# Patient Record
Sex: Female | Born: 1935 | Race: Black or African American | Hispanic: No | Marital: Single | Smoking: Never smoker
Health system: Southern US, Community
[De-identification: ages and names within clinical notes are randomized; demographics above are authoritative.]

## PROBLEM LIST (undated history)

## (undated) DIAGNOSIS — I10 Essential (primary) hypertension: Secondary | ICD-10-CM

## (undated) DIAGNOSIS — D573 Sickle-cell trait: Secondary | ICD-10-CM

## (undated) DIAGNOSIS — G894 Chronic pain syndrome: Secondary | ICD-10-CM

## (undated) DIAGNOSIS — E079 Disorder of thyroid, unspecified: Secondary | ICD-10-CM

## (undated) DIAGNOSIS — N289 Disorder of kidney and ureter, unspecified: Secondary | ICD-10-CM

## (undated) HISTORY — DX: Sickle-cell trait: D57.3

## (undated) HISTORY — DX: Chronic pain syndrome: G89.4

## (undated) HISTORY — PX: APPENDECTOMY: SHX54

## (undated) HISTORY — DX: Essential (primary) hypertension: I10

---

## 1995-09-14 DIAGNOSIS — G894 Chronic pain syndrome: Secondary | ICD-10-CM

## 1995-09-14 HISTORY — PX: BACK SURGERY: SHX140

## 1995-09-14 HISTORY — DX: Chronic pain syndrome: G89.4

## 2014-09-25 ENCOUNTER — Ambulatory Visit (INDEPENDENT_AMBULATORY_CARE_PROVIDER_SITE_OTHER): Payer: 59 | Admitting: Family Medicine

## 2014-09-25 ENCOUNTER — Encounter: Payer: Self-pay | Admitting: Family Medicine

## 2014-09-25 VITALS — BP 163/90 | HR 65 | Temp 98.0°F | Ht <= 58 in | Wt 180.0 lb

## 2014-09-25 DIAGNOSIS — E038 Other specified hypothyroidism: Secondary | ICD-10-CM

## 2014-09-25 DIAGNOSIS — M549 Dorsalgia, unspecified: Secondary | ICD-10-CM | POA: Diagnosis not present

## 2014-09-25 DIAGNOSIS — I10 Essential (primary) hypertension: Secondary | ICD-10-CM | POA: Diagnosis not present

## 2014-09-25 DIAGNOSIS — M1711 Unilateral primary osteoarthritis, right knee: Secondary | ICD-10-CM | POA: Diagnosis not present

## 2014-09-25 DIAGNOSIS — I5022 Chronic systolic (congestive) heart failure: Secondary | ICD-10-CM | POA: Diagnosis not present

## 2014-09-25 DIAGNOSIS — I5032 Chronic diastolic (congestive) heart failure: Secondary | ICD-10-CM | POA: Insufficient documentation

## 2014-09-25 DIAGNOSIS — G8929 Other chronic pain: Secondary | ICD-10-CM

## 2014-09-25 DIAGNOSIS — E039 Hypothyroidism, unspecified: Secondary | ICD-10-CM | POA: Insufficient documentation

## 2014-09-25 DIAGNOSIS — M170 Bilateral primary osteoarthritis of knee: Secondary | ICD-10-CM | POA: Insufficient documentation

## 2014-09-25 LAB — FERRITIN: Ferritin: 248 ng/mL (ref 10–291)

## 2014-09-25 LAB — TSH: TSH: 34.023 u[IU]/mL — AB (ref 0.350–4.500)

## 2014-09-25 MED ORDER — LEVOTHYROXINE SODIUM 100 MCG PO TABS: 100.0000 ug | ORAL_TABLET | Freq: Every day | ORAL | Status: DC

## 2014-09-25 MED ORDER — FUROSEMIDE 20 MG PO TABS: 20.0000 mg | ORAL_TABLET | Freq: Every day | ORAL | Status: DC

## 2014-09-25 MED ORDER — CYCLOBENZAPRINE HCL 5 MG PO TABS: 5.0000 mg | ORAL_TABLET | Freq: Three times a day (TID) | ORAL | Status: DC | PRN

## 2014-09-25 MED ORDER — POTASSIUM CHLORIDE ER 10 MEQ PO TBCR: 10.0000 meq | EXTENDED_RELEASE_TABLET | Freq: Every day | ORAL | Status: DC

## 2014-09-25 MED ORDER — CLONIDINE HCL 0.1 MG PO TABS: 0.1000 mg | ORAL_TABLET | Freq: Three times a day (TID) | ORAL | Status: DC

## 2014-09-25 MED ORDER — ENALAPRIL MALEATE 20 MG PO TABS: 20.0000 mg | ORAL_TABLET | Freq: Every day | ORAL | Status: DC

## 2014-09-25 NOTE — Progress Notes (Signed)
Subjective:    Patient ID: Emily Campos, female    DOB: 1926-05-21, 81 y.o.   MRN: [id number]  Patient presents to [facility] for new patient evaluation. Has not been to doctor >1 year  HPI  Patient reports that she has been out of all medications for approx 1-3 months. Requesting all refills today.  CHRONIC HTN: Reports - out of current BP meds. No concerns. Doesn't check BP at home. Current Meds - Enalapril 20mg  daily, Clonidine 0.1mg  TID, Lasix 10mg  daily (occasionally up to 20mg ) Previously tolerated well, w/o complaints. Lifestyle - no regular exercise / limited ambulation Denies CP, dyspnea, HA, edema, dizziness / lightheadedness  CHRONIC PAIN / LOW BACK / KNEES: - History of Chronic Low Back Pain, s/p back surgery ("electrodes placed" 1997), stated she had fall while at work and prior dx osteoarthritis in lumbar spine with "disc problems". Stable back pain without worsening, does admit to some occasional pain in right leg, but denies radiation of pain from back. - History of Right knee OA, no prior knee injuries or surgeries, last X-ray >10 years ago, chronic R>L knee pain with pain extending down into Right foot occasionally - Currently taking chronic opiates with Percocet, 5/325 taking every other day only as needed, prescribed by prior medical doctor. Previously took Motrin (advised to DC NSAIDs), no longer takes Tylenol (?intolerance) - Previously seen at Pain Management, not interested in returning - Takes Flexeril 5mg  PRN - Admits RLE chronic edema  - Denies fevers/chills, numbness, tingling, or weakness in legs, bladder / bowel incontinence, saddle anesthesia  H/o CHF: - Reports history of heart disease with CHF, but no further information known. Admits to previously established with Cardiology in [place]. Previously treated with Lasix 10mg  daily sometimes inc to 20mg  daily if needed. - Admits chronic LE edema (ankle/foot > lower leg) Right > Left - Denies any history of DVT/PE  (admits prior work-up in past with [place] due to swelling in Right leg) - Denies chest pain, SOB, orthopnea  PMH: - Hypothyroidism (currently out of levothyroxine) - H/o abdominal hernia x 10 years (not interested in surgery) - Sickle Cell Trait  HM: - influenza vaccine - declined (does not get regular flu shots) - Last Mammogram - unable to recall - Never had colonoscopy  I have reviewed and updated the following as appropriate: allergies and current medications  Social Hx:  - Moved from [place] to [place] in March 2015, family in [place] (cousin) with plans to move there eventually. Currently Grandson lives nearby in [place], daughter in [place] - Lives home alone, uses walker to ambulate, performs ADLs  Review of Systems  See above HPI   Objective:   Physical Exam  BP 163/90 mmHg  Pulse 65  Temp(Src) 98 F (36.7 C) (Oral)  Ht 4\' 3"  (1.295 m)  Wt 180 lb (81.647 kg)  BMI 48.69 kg/m2  Gen - chronically ill but well-appearing elderly F, resting comfortably with walker, NAD HEENT - NCAT, PERRL, EOMI, oropharynx clear, MMM Neck - supple, non-tender Heart - RRR, no murmurs heard Lungs - CTAB, no wheezing, crackles, or rhonchi. Normal work of breathing. MSK - - Back: mild tenderness over lower thoracic and all lumbar spinous processes and mild lumbar paraspinal tenderness w/ mild paraspinal muscle hypertonicity. Seated SLR Right with back pain but no radicular symptoms, Left negative - Right Knee: symmetrical to Left without effusion, erythema, or deformity. +crepitus with ROM, limited full knee flexion, generalized tenderness to palpation anterior aspect Ext - mild tenderness to  Right foot/ankle with +1 pitting edema foot to mid shin, peripheral pulses intact Skin - warm, dry, no rashes Neuro - awake, alert, oriented, grossly non-focal, intact muscle strength 5/5 b/l grip, knee flex/ext, ankle dorsi/plantar flexion    Assessment & Plan:   See specific A&P problem list for details.

## 2014-09-25 NOTE — Assessment & Plan Note (Addendum)
Reported chronic h/o CHF, followed by Cardiology in [place]. Without recent exacerbation. - Currently mostly euvolemic with some inc chronic edema RLE > L - Out of medications currently - Unknown when last ECHO, no records  Plan: 1. Order ECHO for further evaluation, consider future referral to Cardiology to get established 2. Reorder Lasix 20mg  daily (usually takes 10mg  daily, but sometimes needs inc to 20) 3. Reorder potassium supplement to take daily with Lasix 4. Reorder ACEi / Enalapril 20mg  daily 5. Check ferritin for CHF 6. RTC 1 month re-eval

## 2014-09-25 NOTE — Assessment & Plan Note (Signed)
Chronic h/o hypothyroidism on levothyroxine, however currently admits to out of all meds 1-3 months since moved to [facility]  Plan: 1. Order TSH - significantly elevated at 34 2. Resume Levothyroxine therapy at previous dose 169mcg daily 3. Consider repeat TSH in 6-8 weeks on therapy, titrate as needed

## 2014-09-25 NOTE — Assessment & Plan Note (Signed)
Elevated BP, currently out of medications, above goal < 150/90 for elderly pt  Plan: 1. Reorder Enalapril 20mg  daily 2. Reorder Lasix 10-20mg  daily 3. Reorder Clonidine 0.1mg  TID - question reasoning for clonidine in regimen, but plan to resume prior meds for several months to see if controlled, otherwise would consider switching to alternative therapy 4. Check BMET, Lipid panel today 5. RTC 1 month

## 2014-09-25 NOTE — Assessment & Plan Note (Signed)
Chronic LBP with reported h/o OA with DJD, s/p electrode implant back surgery, initial injury with fall 10 years ago. - No previous records or imaging available  Plan: 1. Ordered T and L spine X-rays to eval OA/DJD 2. Refilled Flexeril PRN (takes low dose 2.5mg  to 5mg ) 3. Defer rx Percocet 5/325 today - follow-up X-rays, records, evaluate pain at future visit, re-attempt discuss options with Pain Management referral, not interested in other modalities such as PT or injections, would also need Pain Contract and UDS if proceeding 4. Plan to request records from previous doctors in [place] 5. RTC 1 month re-eval

## 2014-09-25 NOTE — Patient Instructions (Signed)
Dear Emily Campos, Thank you for coming in to clinic today. It was good to meet you!  1. Today we discuss your general medical health. 2. I have ordered your medications - sent to pharmacy 3. Ordered X-rays of your Back and Knees - go to [facility] (Radiology Department) to get these done at anytime. 4. Ordered an ECHOcardiogram, we will work with you to schedule this, and plan to refer you to cardiologist in future.  Some important numbers from today's visit: BP - BP 163/90 Results -  Please schedule a follow-up appointment with Dr. Parks Ranger 1 month to review X-rays and lab results. Discuss pain medication.  If you have any other questions or concerns, please feel free to call the clinic to contact me. You may also schedule an earlier appointment if necessary.  However, if your symptoms get significantly worse, please go to the Emergency Department to seek immediate medical attention.  Nobie Putnam, [place]

## 2014-09-25 NOTE — Assessment & Plan Note (Addendum)
Stable, consistent with chronic R-knee OA. Likely increased edema of Right leg due to limited movement/ROM. - No reported recent images, no other records  Plan: 1. Ordered bilateral knee standing X-rays eval OA 2. Refilled Flexeril per request 3. See details under "Chronic Back Pain" for pain management

## 2014-09-26 ENCOUNTER — Encounter: Payer: Self-pay | Admitting: Family Medicine

## 2014-09-26 DIAGNOSIS — N183 Chronic kidney disease, stage 3 unspecified: Secondary | ICD-10-CM | POA: Insufficient documentation

## 2014-09-26 LAB — LIPID PANEL
Cholesterol: 224 mg/dL — ABNORMAL HIGH (ref 0–200)
HDL: 42 mg/dL (ref >39)
LDL Cholesterol: 147 mg/dL — ABNORMAL HIGH (ref 0–99)
Total CHOL/HDL Ratio: 5.3 Ratio
Triglycerides: 177 mg/dL — ABNORMAL HIGH (ref <150)
VLDL: 35 mg/dL (ref 0–40)

## 2014-09-26 LAB — BASIC METABOLIC PANEL
BUN: 21 mg/dL (ref 6–23)
CO2: 23 mEq/L (ref 19–32)
CREATININE: 1.73 mg/dL — AB (ref 0.50–1.10)
Calcium: 9.3 mg/dL (ref 8.4–10.5)
Chloride: 106 mEq/L (ref 96–112)
Glucose, Bld: 87 mg/dL (ref 70–99)
POTASSIUM: 4.2 meq/L (ref 3.5–5.3)
Sodium: 138 mEq/L (ref 135–145)

## 2014-10-04 ENCOUNTER — Ambulatory Visit (HOSPITAL_COMMUNITY): Payer: 59

## 2014-11-01 ENCOUNTER — Telehealth: Payer: Self-pay | Admitting: Family Medicine

## 2014-11-01 NOTE — Telephone Encounter (Signed)
Appointment scheduled for 2/23 at [facility]. Tried calling patient to inform, line busy, will try again later.

## 2014-11-01 NOTE — Telephone Encounter (Signed)
Emily Campos need to have the appt for the Echo [facility] at [facility] rescheduled due to missed appt.  Please call when new one made

## 2014-11-04 NOTE — Telephone Encounter (Signed)
Patient informed, expressed understanding. 

## 2014-11-05 ENCOUNTER — Ambulatory Visit (HOSPITAL_COMMUNITY)
Admission: RE | Admit: 2014-11-05 | Discharge: 2014-11-05 | Disposition: A | Discharge status: Home / Self Care | Payer: Medicare Other | Source: Ambulatory Visit | Attending: Family Medicine | Admitting: Family Medicine

## 2014-11-05 DIAGNOSIS — I5022 Chronic systolic (congestive) heart failure: Secondary | ICD-10-CM | POA: Insufficient documentation

## 2014-11-05 DIAGNOSIS — I509 Heart failure, unspecified: Secondary | ICD-10-CM

## 2014-11-05 NOTE — Progress Notes (Signed)
  Echocardiogram 2D Echocardiogram has been performed.  Emily Campos M 11/05/2014, 4:04 PM

## 2014-11-06 ENCOUNTER — Encounter: Payer: Self-pay | Admitting: Family Medicine

## 2015-03-14 ENCOUNTER — Ambulatory Visit: Payer: Medicare Other | Admitting: Family Medicine

## 2015-03-19 ENCOUNTER — Ambulatory Visit (HOSPITAL_COMMUNITY)
Admission: RE | Admit: 2015-03-19 | Discharge: 2015-03-19 | Disposition: A | Discharge status: Home / Self Care | Payer: Medicare Other | Source: Ambulatory Visit | Attending: Family Medicine | Admitting: Family Medicine

## 2015-03-19 ENCOUNTER — Ambulatory Visit (INDEPENDENT_AMBULATORY_CARE_PROVIDER_SITE_OTHER): Payer: Medicare Other | Admitting: Family Medicine

## 2015-03-19 ENCOUNTER — Encounter: Payer: Self-pay | Admitting: Family Medicine

## 2015-03-19 ENCOUNTER — Other Ambulatory Visit: Payer: Self-pay | Admitting: Family Medicine

## 2015-03-19 VITALS — BP 190/88 | Temp 98.2°F | Ht <= 58 in | Wt 171.3 lb

## 2015-03-19 DIAGNOSIS — N183 Chronic kidney disease, stage 3 unspecified: Secondary | ICD-10-CM

## 2015-03-19 DIAGNOSIS — M1711 Unilateral primary osteoarthritis, right knee: Secondary | ICD-10-CM

## 2015-03-19 DIAGNOSIS — I10 Essential (primary) hypertension: Secondary | ICD-10-CM

## 2015-03-19 DIAGNOSIS — E785 Hyperlipidemia, unspecified: Secondary | ICD-10-CM | POA: Insufficient documentation

## 2015-03-19 DIAGNOSIS — M545 Low back pain: Secondary | ICD-10-CM | POA: Insufficient documentation

## 2015-03-19 DIAGNOSIS — E038 Other specified hypothyroidism: Secondary | ICD-10-CM

## 2015-03-19 DIAGNOSIS — M549 Dorsalgia, unspecified: Secondary | ICD-10-CM | POA: Diagnosis not present

## 2015-03-19 DIAGNOSIS — M25561 Pain in right knee: Secondary | ICD-10-CM | POA: Insufficient documentation

## 2015-03-19 DIAGNOSIS — G8929 Other chronic pain: Secondary | ICD-10-CM | POA: Diagnosis not present

## 2015-03-19 DIAGNOSIS — M25562 Pain in left knee: Secondary | ICD-10-CM | POA: Insufficient documentation

## 2015-03-19 DIAGNOSIS — D259 Leiomyoma of uterus, unspecified: Secondary | ICD-10-CM | POA: Insufficient documentation

## 2015-03-19 DIAGNOSIS — I5032 Chronic diastolic (congestive) heart failure: Secondary | ICD-10-CM

## 2015-03-19 DIAGNOSIS — M5136 Other intervertebral disc degeneration, lumbar region: Secondary | ICD-10-CM | POA: Diagnosis not present

## 2015-03-19 DIAGNOSIS — M4186 Other forms of scoliosis, lumbar region: Secondary | ICD-10-CM | POA: Diagnosis not present

## 2015-03-19 DIAGNOSIS — M170 Bilateral primary osteoarthritis of knee: Secondary | ICD-10-CM

## 2015-03-19 LAB — BASIC METABOLIC PANEL WITH GFR
BUN: 15 mg/dL (ref 6–23)
CO2: 22 mEq/L (ref 19–32)
Calcium: 9.5 mg/dL (ref 8.4–10.5)
Chloride: 105 mEq/L (ref 96–112)
Creat: 1.49 mg/dL — ABNORMAL HIGH (ref 0.50–1.10)
GFR, EST NON AFRICAN AMERICAN: 32 mL/min — AB
GFR, Est African American: 36 mL/min — ABNORMAL LOW
Glucose, Bld: 89 mg/dL (ref 70–99)
POTASSIUM: 4.2 meq/L (ref 3.5–5.3)
SODIUM: 140 meq/L (ref 135–145)

## 2015-03-19 LAB — TSH: TSH: 2.981 u[IU]/mL (ref 0.350–4.500)

## 2015-03-19 MED ORDER — ENALAPRIL MALEATE 20 MG PO TABS: 20.0000 mg | ORAL_TABLET | Freq: Every day | ORAL | Status: DC

## 2015-03-19 MED ORDER — CLONIDINE HCL 0.1 MG PO TABS: 0.1000 mg | ORAL_TABLET | Freq: Three times a day (TID) | ORAL | Status: DC

## 2015-03-19 MED ORDER — POTASSIUM CHLORIDE ER 10 MEQ PO TBCR: 10.0000 meq | EXTENDED_RELEASE_TABLET | Freq: Every day | ORAL | Status: DC

## 2015-03-19 MED ORDER — PRAVASTATIN SODIUM 40 MG PO TABS: 40.0000 mg | ORAL_TABLET | Freq: Every day | ORAL | Status: DC

## 2015-03-19 MED ORDER — FUROSEMIDE 20 MG PO TABS: 10.0000 mg | ORAL_TABLET | Freq: Every day | ORAL | Status: DC

## 2015-03-19 NOTE — Progress Notes (Signed)
   Subjective:    Patient ID: Emily Campos, female    DOB: 03/24/1926, 81 y.o.   MRN: [id number]  Patient presents with grandson Kasandra Knudsen.  HPI  CHRONIC HTN: Reports - out of current BP meds. No concerns. Doesn't check BP at home. Current Meds - Enalapril 20mg  daily, Clonidine 0.1mg  TID (misses doses, forgets), Lasix 10mg  daily (occasionally up to 20mg ) Previously tolerated well, w/o complaints. Lifestyle - no regular exercise / limited ambulation Denies CP, dyspnea, HA, edema, dizziness / lightheadedness  CHRONIC PAIN / LOW BACK / KNEES: - History of Chronic Low Back Pain, s/p back surgery ("electrodes placed" 1997), known Lumbar OA and prior fall injury >10 years ago. No prior imaging on chart and not available, did not complete ROI at last visit. - X-rays ordered at last visit but not obtained by patient. - Previously treated with chronic opiates Percocet 5/325 taking qod PRN rx by PCP in [place], have not received any records yet. No longer able to take NSAIDs due to CKD. Has not tried Tylenol. - Taking Flexeril PRN - Requesting for chronic narcotic rx today, initially declines to see Pain Management, but unable to provide appropriate reasoning. Now willing to establish with Pain Management for evaluation. - Denies fevers/chills, numbness, tingling, or weakness in legs, bladder / bowel incontinence, saddle anesthesia  Chronic Diastolic Grd 1 CHF, HFpEF: - Chronic known h/o prior CHF, no prior records from Cardiology in [place], had been on intermittent Lasix PRN b/l LE edema. Recently obtained ECHO 10/2014 with preserved LVEF 01-60%, Grd 1 diastolic dysfunction, otherwise normal wall motion, mild MR, without other valvular abnormalities. - Currently stable symptoms. Chronic known orthopnea at 4 pillows, sleeps elevated without change. Continues to have chronic b/l R>L lower leg edema, tries to elevate, does not wear compression stockings - Taking Lasix 20mg  (half tabs at 10mg  daily PRN, most  days) - Not established with Cardiology yet in [place] - Denies new or worsening CP, SOB, palpitations, syncope  HYPOTHYROIDISM: - Known chronic h/o hypothyroidism. Last TSH 34 in 09/2014, had been off levothyroxine for months. - Currently taking Levothyroxine 18mcg daily, occasionally misses doses - No new complaints  HM: - Declines immunizations. Does not want to discuss colonoscopy, mammogram.  I have reviewed and updated the following as appropriate: allergies and current medications  Social Hx:  - Living in apartment, alone, uses walker to ambulate, able to perform ADLs, grandson Kasandra Knudsen nearby often provides help with medications. - Moved from [place] to [facility] in March 2015  Review of Systems  See above HPI   Objective:   Physical Exam  Temp(Src) 98.2 F (36.8 C) (Oral)  Ht 4\' 3"  (1.295 m)  Wt 171 lb 4.8 oz (77.701 kg)  BMI 46.33 kg/m2  Gen - elderly AAF, chronically ill but well-appearing currently, comfortable in wheelchair, NAD HEENT - oropharynx clear, MMM Neck - supple, non-tender, no thyromegaly Heart - RRR, no murmurs heard Lungs - CTAB, no wheezing, crackles, or rhonchi. Non-labored. Good air movement. MSK - - Back: Stable exam from previous with mild +TTP lower T-L spine. B/l paraspinal muscle hypertonicity, seated SLR R>L back pain without radiculopathy Ext - Stable R>L +1 pitting edema from foot to below knee, non-tender, symmetrical calves without erythema, peripheral pulses intact Skin - warm, dry, no rashes Neuro - awake, alert, orientedx3, grossly non-focal, intact muscle strength 5/5 b/l grip, knee flex/ext, ankle dorsi/plantar flexion    Assessment & Plan:   See specific A&P problem list for details.

## 2015-03-19 NOTE — Assessment & Plan Note (Addendum)
Presumed b/l SCr approx 1.7, known CKD previously. Likely due to chronic uncontrolled HTN - No NSAIDs  Plan: 1. Re-check BMET 2. Adjust lasix dose PRN, referral to Cards to assist with med management, in dCHF 3. Consider Renal referral if stable to worsening, currently on ACEi  UPDATE 7/6 - BMET results with SCr 1.49 (from 1.73), stable K, otherwise normal. Likely new baseline, stable CKD-III

## 2015-03-19 NOTE — Assessment & Plan Note (Signed)
Elevated lipids, with LDL >140. Elevated ASCVD 10 yr risk, indicated for mod intensity statin if tolerated. No previous h/o on statin therapy.  Plan: 1. Start Pravastatin 40mg  daily, titrate according to side-effects, counseled on myalgias 2. RTC PRN

## 2015-03-19 NOTE — Assessment & Plan Note (Addendum)
Significantly elevated BP up to SBP 190, asymptomatic. Manual re-check down to SBP 160, out of Enalapril and Clonidine (had previously missed doses, 3 mo supply lasted 6 mo, did not request refills or follow-up as instructed). Above goal < 150/90 - Poor med adherence (?memory difficulties, now with grandson helping for meds)  Plan: 1. Refilled anti-HTN meds - Enalapril 20mg  daily, Clonidine 0.1mg  TID, Lasix 10-20mg  daily - given 1 year supply 2. Improve med adherence - pill organizer, have grandson help 3. Re-check BMET, elevated SCr at last check 1.7 (known CKD, likely new baseline) 4. Lifestyle mods - reduce salt/sodium intake, inc fresh vegetables, limited exercise 5. RTC 1 month for BP re-check, will call to remind pt given poor follow-up previously 6. Referral to Cardiology for f/u [facility] and uncontrolled HTN

## 2015-03-19 NOTE — Assessment & Plan Note (Addendum)
Stable, chronic hypothyroidism. Concerns with missed doses of levothyroxine. - Last TSH 34 (09/2014)  Plan: 1. Continue Levothyroxine 191mcg daily, advised adherence to meds 2. Re-check TSH today - results at 2.981 (normal range), no change to dose.

## 2015-03-19 NOTE — Assessment & Plan Note (Signed)
Chronic LBP with reported h/o OA with DJD, s/p electrode implant back surgery, initial injury with fall 10 years ago. - Did not follow-up with previously ordered X-rays  Plan: 1. Advised to go to [facility] to get previously ordered X-rays (T/L spine, b/l knees) 2. May continue Flexeril 3. ROI completed for PCP in [place] 4. Referral to Pain Management - not offer chronic narcotics today  UPDATE 7/6 X-ray results: L-knee: No acute abnormality. Severe tricompartment degenerative change R-knee: No acute. Mild degenerative changes in the patellofemoral compartment. T-spine: No acute. Moderate thoracolumbar dextroscoliosis L-spine: DJD L3-S1, with straightened aligment, s/p bony fusion L2-L5, Lumbar dextroscoliosis 31 deg

## 2015-03-19 NOTE — Assessment & Plan Note (Signed)
Stable, chronic diastolic CHF (grd 1 diastolic dysfxn) with HFpEF - Last ECHO 10/2014: LVEF 60-65% - Previously ferritin normal >200  Plan: 1. Referral to Cardiology to resume chronic follow-up, also with poor BP control 2. Refilled Lasix 10-20mg  daily 3. Continue K supplement 4. Cont ACEi

## 2015-03-19 NOTE — Patient Instructions (Addendum)
Dear Emily Campos, Thank you for coming in to clinic today. It was good to see you, and good to meet you Emily Campos.  1. Refilled your medications today, given 1 year supply. Make sure you use a pill box and have Emily Campos help you organize your meds so you don't forget to take them. 2. BP elevated but improved on re-check. 3. Re-check labs today - Kidney Function and TSH 4. Start new cholesterol medication - Pravastatin take one tablet nightly - if you develop significant muscle cramps or pains, please call us and stop the medication. This is a possible side-effect. 5. Referral placed to Cardiology (heart doctor) they will call you with apt 6. Referral placed to Pain Management - they will call you with apt  Some important numbers from today's visit:  BP - Initially 190s >> improved to 162/90 Results -  Ordered X-rays of your Back and Knees - go to [facility] (Radiology Department) to get these done at anytime.  Please schedule a follow-up appointment with Dr. Parks Ranger in 1 month to re-check BP and 6 months to follow-up  If you have any other questions or concerns, please feel free to call the clinic to contact me. You may also schedule an earlier appointment if necessary.  However, if your symptoms get significantly worse, please go to the Emergency Department to seek immediate medical attention.  Nobie Putnam, [place]

## 2015-03-20 ENCOUNTER — Telehealth: Payer: Self-pay | Admitting: Family Medicine

## 2015-03-20 ENCOUNTER — Other Ambulatory Visit: Payer: Self-pay | Admitting: Family Medicine

## 2015-03-20 DIAGNOSIS — M549 Dorsalgia, unspecified: Principal | ICD-10-CM

## 2015-03-20 DIAGNOSIS — E038 Other specified hypothyroidism: Secondary | ICD-10-CM

## 2015-03-20 DIAGNOSIS — G8929 Other chronic pain: Secondary | ICD-10-CM

## 2015-03-20 MED ORDER — LEVOTHYROXINE SODIUM 100 MCG PO TABS: 100.0000 ug | ORAL_TABLET | Freq: Every day | ORAL | Status: DC

## 2015-03-20 MED ORDER — CYCLOBENZAPRINE HCL 5 MG PO TABS: 5.0000 mg | ORAL_TABLET | Freq: Three times a day (TID) | ORAL | Status: DC | PRN

## 2015-03-20 NOTE — Telephone Encounter (Signed)
Refilled Flexeril with +2 refills and Levothyroxine with +11 refills.  Nobie Putnam, [place], PGY-3

## 2015-03-20 NOTE — Telephone Encounter (Signed)
Pt called because she needs refills on her Flexeril and Levothyroxine. jw

## 2015-03-20 NOTE — Telephone Encounter (Signed)
Called patient to review lab and x-ray results from last OV 7/6. See detailed progress note for discussion. Briefly, SCr 1.49 (stable at baseline, CKD-III), otherwise BMET unremarkable. TSH normalized at 2.98 (from 34 in 09/2014), continue current Levothyroxine dose 135mcg daily without change (refills sent to pharmacy 1 year supply). Reviewed X-ray results, known multi-site DJD bilateral knees, T/L spine, with dextroscoliosis and some straightening, bony fusion s/p surgery from history of traumatic injury/fall. Pending scheduling from referral on 7/6 for Pain Management.  Additionally, given elevated BP yesterday, SBP 190s, down to SBP 160s on manual re-check. Pt non-adherent to BP meds. Advised patient to follow-up within next 4 weeks for HTN follow-up for BP check. Encouraged med adherence, family to help. Follow-up sooner if needed.  Nobie Putnam, [place], PGY-3

## 2015-03-31 ENCOUNTER — Telehealth: Payer: Self-pay | Admitting: *Deleted

## 2015-03-31 DIAGNOSIS — I10 Essential (primary) hypertension: Secondary | ICD-10-CM

## 2015-03-31 MED ORDER — ENALAPRIL MALEATE 20 MG PO TABS: 20.0000 mg | ORAL_TABLET | Freq: Every day | ORAL | Status: DC

## 2015-03-31 NOTE — Telephone Encounter (Signed)
Pharmacist call to request 90 day supply.  Verbal order given by Dr. Parks Ranger to change quantity to 90 day, with 3 refills.  Derl Barrow, RN

## 2015-04-11 ENCOUNTER — Ambulatory Visit: Payer: Medicare Other | Admitting: Internal Medicine

## 2015-05-20 ENCOUNTER — Encounter: Payer: Self-pay | Admitting: *Deleted

## 2015-05-22 ENCOUNTER — Ambulatory Visit (INDEPENDENT_AMBULATORY_CARE_PROVIDER_SITE_OTHER): Payer: Medicare Other | Admitting: Interventional Cardiology

## 2015-05-22 ENCOUNTER — Encounter: Payer: Self-pay | Admitting: Interventional Cardiology

## 2015-05-22 VITALS — BP 190/92 | HR 62 | Ht <= 58 in | Wt 174.0 lb

## 2015-05-22 DIAGNOSIS — I10 Essential (primary) hypertension: Secondary | ICD-10-CM | POA: Diagnosis not present

## 2015-05-22 DIAGNOSIS — I5032 Chronic diastolic (congestive) heart failure: Secondary | ICD-10-CM | POA: Diagnosis not present

## 2015-05-22 DIAGNOSIS — I1310 Hypertensive heart and chronic kidney disease without heart failure, with stage 1 through stage 4 chronic kidney disease, or unspecified chronic kidney disease: Secondary | ICD-10-CM | POA: Diagnosis not present

## 2015-05-22 DIAGNOSIS — N183 Chronic kidney disease, stage 3 (moderate): Secondary | ICD-10-CM

## 2015-05-22 DIAGNOSIS — I119 Hypertensive heart disease without heart failure: Secondary | ICD-10-CM

## 2015-05-22 DIAGNOSIS — IMO0001 Reserved for inherently not codable concepts without codable children: Secondary | ICD-10-CM

## 2015-05-22 DIAGNOSIS — E785 Hyperlipidemia, unspecified: Secondary | ICD-10-CM

## 2015-05-22 MED ORDER — AMLODIPINE BESYLATE 5 MG PO TABS: 5.0000 mg | ORAL_TABLET | Freq: Every day | ORAL | Status: DC

## 2015-05-22 NOTE — Patient Instructions (Signed)
Medication Instructions:   START TAKING AMLODIPINE 5 MG ONCE A DAY    Labwork:  NONE ORDER TODAY    Testing/Procedures:  NONE ORDER TODAY   Follow-Up:  IN 2 TO 3 WEEKS  WITH HYPERTENSION CLINIC WITH  Elita Boone D   Any Other Special Instructions Will Be Listed Below (If Applicable).

## 2015-05-22 NOTE — Progress Notes (Signed)
Patient ID: Athena Baltz, female   DOB: [birth year], 81 y.o.   MRN: [id number]     Cardiology Office Note   Date:  05/22/2015   ID:  Elizebeth Kluesner, DOB [birth year], MRN [id number]  PCP:  Nobie Putnam, DO    No chief complaint on file. f/u diastolic heart failure   Wt Readings from Last 3 Encounters:  05/22/15 174 lb (78.926 kg)  03/19/15 171 lb 4.8 oz (77.701 kg)  09/25/14 180 lb (81.647 kg)       History of Present Illness: Tiffiany Beadles is a 81 y.o. female  who has known history of diastolic heart failure. She moved here from [place].  Her daughter died of what sounds like prion disease in 2010.  She now has moved here to live with her grandson.  She would like to move to [place] as she has a cousin there.  Most recent echocardiogram in February 2016 showed normal left jugular function. She had been treated with Lasix in the past. She has also had difficult to control hypertension and associated renal insufficiency.  Se has chronic back pain s/p surgery after a fall.  SHe has had chronic right leg swelling.  She has been checked for DVT in the past that was negative.  She cannot lie flat due to chronic back pain.   She has had oxygen in the past but has not needed this for some time.   She has not taken her meds this AM.    Past Medical History  Diagnosis Date  . Sickle cell trait   . Chronic pain syndrome 1997  . HTN (hypertension)     Past Surgical History  Procedure Laterality Date  . Appendectomy    . Back surgery  1997     Current Outpatient Prescriptions  Medication Sig Dispense Refill  . cloNIDine (CATAPRES) 0.1 MG tablet Take 1 tablet (0.1 mg total) by mouth 3 (three) times daily. 90 tablet 11  . cyclobenzaprine (FLEXERIL) 5 MG tablet Take 1 tablet (5 mg total) by mouth 3 (three) times daily as needed for muscle spasms. 30 tablet 2  . enalapril (VASOTEC) 20 MG tablet Take 1 tablet (20 mg total) by mouth daily. 90 tablet 3  . furosemide  (LASIX) 20 MG tablet Take 0.5-1 tablets (10-20 mg total) by mouth daily. 30 tablet 5  . levothyroxine (SYNTHROID, LEVOTHROID) 100 MCG tablet Take 1 tablet (100 mcg total) by mouth daily before breakfast. 30 tablet 11  . potassium chloride (K-DUR) 10 MEQ tablet Take 1 tablet (10 mEq total) by mouth daily. 30 tablet 11  . pravastatin (PRAVACHOL) 40 MG tablet Take 1 tablet (40 mg total) by mouth at bedtime. 30 tablet 11   No current facility-administered medications for this visit.    Allergies:   Review of patient's allergies indicates no known allergies.    Social History:  The patient  reports that she has never smoked. She has never used smokeless tobacco. She reports that she does not drink alcohol or use illicit drugs.   Family History:  The patient's family history includes Heart disease in her mother; High blood pressure in her mother.    ROS:  Please see the history of present illness.   Otherwise, review of systems are positive for back pain and right leg swelling.   All other systems are reviewed and negative.    PHYSICAL EXAM: VS:  BP 190/92 mmHg  Pulse 62  Ht 4\' 3"  (1.295 m)  Wt 174 lb (78.926  kg)  BMI 47.06 kg/m2 , BMI Body mass index is 47.06 kg/(m^2). GEN: Well nourished, well developed, in no acute distress HEENT: normal Neck: no JVD, carotid bruits, or masses Cardiac: RRR; no murmurs, rubs, or gallops, right leg edema  Respiratory:  clear to auscultation bilaterally, normal work of breathing GI: soft, nontender, nondistended, + BS MS: walks hunched over, slow gait Skin: warm and dry, no rash Neuro:  Strength and sensation are intact Psych: euthymic mood, full affect   EKG:   The ekg ordered today demonstrates NSR, nonspecific ST segment changes   Recent Labs: 03/19/2015: BUN 15; Creat 1.49*; Potassium 4.2; Sodium 140; TSH 2.981   Lipid Panel    Component Value Date/Time   CHOL 224* 09/25/2014 1037   TRIG 177* 09/25/2014 1037   HDL 42 09/25/2014 1037    CHOLHDL 5.3 09/25/2014 1037   VLDL 35 09/25/2014 1037   LDLCALC 147* 09/25/2014 1037     Other studies Reviewed: Additional studies/ records that were reviewed today with results demonstrating: echo as above so the form diseas and they can adjust medicine. So I sort out what I want theme.   ASSESSMENT AND PLAN:  1. Chronic diastolic heart failure: Continue diuretics for fluid management. She appears euvolemic at this time. She is limited by orthopedic and chronic pain issues. Overall, she is quite frail and does not appear to be a good candidate for any invasive management, especially given her renal insufficiency. 2. Hypertension with chronic kidney disease.  Start amlodipine 5 mg daily. Hopefully, this will be easier for her to take on a regular basis. She is currently on 3 times a day clonidine. Based on the notes in the chart, there is question about her compliance with medication. Hopefully, getting on a once a day medicine will be easier for her compliance. If her blood pressures are improved, to try to wean off the clonidine. Will have her follow-up in our pharmacist hypertension clinic in 2-3 weeks. Would try to titrate amlodipine if blood pressures are still high at that time. I encouraged her to try to check her blood pressure and keep a record outside of the doctor's office.  BP 174/80 on my recheck. 3. Hyperlipidemia: TG and LDL increased. COntinue pravastatin.  Followed by primary care physician.   Current medicines are reviewed at length with the patient today.  The patient concerns regarding her medicines were addressed.  The following changes have been made:  Start amlodipine  Labs/ tests ordered today include:  No orders of the defined types were placed in this encounter.    Recommend 150 minutes/week of aerobic exercise Low fat, low carb, high fiber diet recommended  Disposition:   FU in 2-3 weeks BP check   Teresita Madura., MD  05/22/2015 9:04 AM    [facility] [place], [place],   [address] Phone: [phone]; Fax: [phone]

## 2015-06-17 ENCOUNTER — Ambulatory Visit: Payer: Medicare Other | Admitting: Pharmacist

## 2015-08-20 ENCOUNTER — Telehealth: Payer: Self-pay | Admitting: *Deleted

## 2015-08-20 DIAGNOSIS — E785 Hyperlipidemia, unspecified: Secondary | ICD-10-CM

## 2015-08-20 MED ORDER — PRAVASTATIN SODIUM 40 MG PO TABS: 40.0000 mg | ORAL_TABLET | Freq: Every day | ORAL | Status: DC

## 2015-08-20 NOTE — Telephone Encounter (Signed)
Received a call from [facility] requesting a 90 day supply for the pravastatin (PRAVACHOL) 40 MG tablet.  Verbal order given by Dr. Parks Ranger to fill pravastatin 40 mg; #90 1 tablet once daily.  Medication e-prescribed.  Derl Barrow, RN

## 2015-09-16 ENCOUNTER — Telehealth: Payer: Self-pay | Admitting: Family Medicine

## 2015-09-16 NOTE — Telephone Encounter (Signed)
Pt is calling to see if we received her paperwork that was faxed from her daughter? It is copies of her insurance. jw

## 2015-09-17 ENCOUNTER — Ambulatory Visit (INDEPENDENT_AMBULATORY_CARE_PROVIDER_SITE_OTHER): Payer: Medicare Other | Admitting: Family Medicine

## 2015-09-17 VITALS — BP 157/76 | HR 65 | Temp 97.8°F | Ht <= 58 in | Wt 172.0 lb

## 2015-09-17 DIAGNOSIS — J321 Chronic frontal sinusitis: Secondary | ICD-10-CM | POA: Diagnosis not present

## 2015-09-17 DIAGNOSIS — G8929 Other chronic pain: Secondary | ICD-10-CM

## 2015-09-17 DIAGNOSIS — R05 Cough: Secondary | ICD-10-CM | POA: Diagnosis not present

## 2015-09-17 DIAGNOSIS — M549 Dorsalgia, unspecified: Secondary | ICD-10-CM

## 2015-09-17 DIAGNOSIS — M170 Bilateral primary osteoarthritis of knee: Secondary | ICD-10-CM

## 2015-09-17 DIAGNOSIS — G894 Chronic pain syndrome: Secondary | ICD-10-CM | POA: Diagnosis not present

## 2015-09-17 DIAGNOSIS — J329 Chronic sinusitis, unspecified: Secondary | ICD-10-CM | POA: Insufficient documentation

## 2015-09-17 DIAGNOSIS — R053 Chronic cough: Secondary | ICD-10-CM

## 2015-09-17 DIAGNOSIS — J310 Chronic rhinitis: Secondary | ICD-10-CM | POA: Insufficient documentation

## 2015-09-17 MED ORDER — HYDROCODONE-HOMATROPINE 5-1.5 MG/5ML PO SYRP: 5.0000 mL | ORAL_SOLUTION | Freq: Four times a day (QID) | ORAL | Status: DC | PRN

## 2015-09-17 MED ORDER — AMOXICILLIN-POT CLAVULANATE 875-125 MG PO TABS
1.0000 | ORAL_TABLET | Freq: Two times a day (BID) | ORAL | Status: DC
Start: 2015-09-17 — End: 2016-04-27

## 2015-09-17 NOTE — Assessment & Plan Note (Signed)
Placed new referral for Pain Management, patient declined last apt, but now agreeable. Advised her that no intent to start new percocet rx today.

## 2015-09-17 NOTE — Assessment & Plan Note (Signed)
Consistent with chronic rhinosinusitis, likely initially viral URI vs allergic etiology with improvement now with persistent sinus congestion. Possible allergic rhinitis component. - Afebrile, well-appearing, sinuses only mildly tender without purulence  Plan: 1. Discussion, and decided on starting trial Augmentin 875-125mg  PO BID x 10 days 2. Hycodan cough syrup 3. Improve hydration, consider future flonase, nasal saline PRN 4. Return criteria reviewed

## 2015-09-17 NOTE — Telephone Encounter (Signed)
[place] office has new copies of her insurance card.  Nobie Putnam, [place], PGY-3

## 2015-09-17 NOTE — Progress Notes (Signed)
Subjective:    Patient ID: Emily Campos, female    DOB: 09-25-1925, 82 y.o.   MRN: [id number]  Emily Campos is a 82 y.o. female presenting on 09/17/2015 for URI; Cough; and Generalized Body Aches  HPI   URI SYMPTOMS, COUGH, SORE THROAT: - Reports chronic recurrent symptoms with congestion, cough, sore throat, some gas and abdomen bloating intermittently over past 2 months, some improvement now. Tried tea and honey. OTC meds no significant relief. Denies fevers, chills, sweats, abdominal pain, diarrhea, nausea or vomiting. Tolerating PO well. Requesting cough syrup.  Chronic Pain Syndrome: - Additionally complains of chronic pain, last visit 03/2015 had requested Percocet, but was referred to Pain Management, patient declined this visit when apt was made by Pain Clinic. She "didn't want to go there, and wanted her primary doctor to prescribe Percocet", could not provide reason why not wanting to go to pain management other than "don't want to waste my time".   Past Medical History  Diagnosis Date  . Sickle cell trait   . Chronic pain syndrome 1997  . HTN (hypertension)     Social History   Social History  . Marital Status: Single    Spouse Name: N/A  . Number of Children: N/A  . Years of Education: N/A   Occupational History  . Not on file.   Social History Main Topics  . Smoking status: Never Smoker   . Smokeless tobacco: Never Used  . Alcohol Use: No  . Drug Use: No  . Sexual Activity: Not Currently     Comment: not sexually active since 1980   Other Topics Concern  . Not on file   Social History Narrative    Current Outpatient Prescriptions on File Prior to Visit  Medication Sig  . amLODipine (NORVASC) 5 MG tablet Take 1 tablet (5 mg total) by mouth daily.  . cloNIDine (CATAPRES) 0.1 MG tablet Take 1 tablet (0.1 mg total) by mouth 3 (three) times daily.  . cyclobenzaprine (FLEXERIL) 5 MG tablet Take 1 tablet (5 mg total) by mouth 3 (three) times daily as  needed for muscle spasms.  . enalapril (VASOTEC) 20 MG tablet Take 1 tablet (20 mg total) by mouth daily.  . furosemide (LASIX) 20 MG tablet Take 0.5-1 tablets (10-20 mg total) by mouth daily.  Marland Kitchen levothyroxine (SYNTHROID, LEVOTHROID) 100 MCG tablet Take 1 tablet (100 mcg total) by mouth daily before breakfast.  . potassium chloride (K-DUR) 10 MEQ tablet Take 1 tablet (10 mEq total) by mouth daily.  . pravastatin (PRAVACHOL) 40 MG tablet Take 1 tablet (40 mg total) by mouth at bedtime.   No current facility-administered medications on file prior to visit.    Review of Systems Per HPI unless specifically indicated above     Objective:    BP 157/76 mmHg  Pulse 65  Temp(Src) 97.8 F (36.6 C) (Oral)  Ht 4\' 3"  (1.295 m)  Wt 172 lb (78.019 kg)  BMI 46.52 kg/m2  Wt Readings from Last 3 Encounters:  09/17/15 172 lb (78.019 kg)  05/22/15 174 lb (78.926 kg)  03/19/15 171 lb 4.8 oz (77.701 kg)    Physical Exam  Constitutional: She appears well-developed and well-nourished. No distress.  Elderly frail 89 yr AAF, currently well appearing  HENT:  Head: Normocephalic and atraumatic.  Mouth/Throat: Oropharynx is clear and moist.  Sinuses mildly tender frontal, not maxillary. No purulence.  Eyes: Conjunctivae are normal.  Neck: Normal range of motion. Neck supple. No thyromegaly present.  Cardiovascular: Normal  rate, regular rhythm, normal heart sounds and intact distal pulses.   No murmur heard. Pulmonary/Chest: Effort normal and breath sounds normal. No respiratory distress. She has no wheezes. She has no rales.  Abdominal: Soft. Bowel sounds are normal. She exhibits no distension. There is no tenderness.  Musculoskeletal: She exhibits no edema.  Lymphadenopathy:    She has no cervical adenopathy.  Neurological: She is alert.  Skin: Skin is warm and dry. She is not diaphoretic.  Nursing note and vitals reviewed.  Results for orders placed or performed in visit on 03/19/15  TSH    Result Value Ref Range   TSH 2.981 0.350 - 4.500 uIU/mL  BASIC METABOLIC PANEL WITH GFR  Result Value Ref Range   Sodium 140 135 - 145 mEq/L   Potassium 4.2 3.5 - 5.3 mEq/L   Chloride 105 96 - 112 mEq/L   CO2 22 19 - 32 mEq/L   Glucose, Bld 89 70 - 99 mg/dL   BUN 15 6 - 23 mg/dL   Creat 1.49 (H) 0.50 - 1.10 mg/dL   Calcium 9.5 8.4 - 10.5 mg/dL   GFR, Est African American 36 (L) mL/min   GFR, Est Non African American 32 (L) mL/min      Assessment & Plan:   Problem List Items Addressed This Visit    Chronic back pain   Relevant Orders   Ambulatory referral to Pain Clinic   Chronic pain syndrome    Placed new referral for Pain Management, patient declined last apt, but now agreeable. Advised her that no intent to start new percocet rx today.      Relevant Orders   Ambulatory referral to Pain Clinic   Chronic rhinosinusitis - Primary    Consistent with chronic rhinosinusitis, likely initially viral URI vs allergic etiology with improvement now with persistent sinus congestion. Possible allergic rhinitis component. - Afebrile, well-appearing, sinuses only mildly tender without purulence  Plan: 1. Discussion, and decided on starting trial Augmentin 875-125mg  PO BID x 10 days 2. Hycodan cough syrup 3. Improve hydration, consider future flonase, nasal saline PRN 4. Return criteria reviewed       Relevant Medications   HYDROcodone-homatropine (HYCODAN) 5-1.5 MG/5ML syrup   amoxicillin-clavulanate (AUGMENTIN) 875-125 MG tablet   Osteoarthritis of both knees   Relevant Orders   Ambulatory referral to Pain Clinic    Other Visit Diagnoses    Chronic cough        Relevant Medications    HYDROcodone-homatropine (HYCODAN) 5-1.5 MG/5ML syrup       Meds ordered this encounter  Medications  . HYDROcodone-homatropine (HYCODAN) 5-1.5 MG/5ML syrup    Sig: Take 5 mLs by mouth every 6 (six) hours as needed for cough.    Dispense:  120 mL    Refill:  0  . amoxicillin-clavulanate  (AUGMENTIN) 875-125 MG tablet    Sig: Take 1 tablet by mouth 2 (two) times daily.    Dispense:  20 tablet    Refill:  0      Follow up plan: Return in about 3 months (around 12/16/2015) for chronic cough, HTN follow-up.  Nobie Putnam, [place], PGY-3

## 2015-09-17 NOTE — Patient Instructions (Signed)
Thank you for coming in to clinic today.  1. It sounds like you have a Sinusitis (Bacterial Infection) - this most likely started as an Upper Respiratory Virus that has settled into an infection. Allergies can also cause this. - Start Augmentin 1 pill twice daily (breakfast and dinner, with food and plenty of water) for 10 days, complete entire course, do not stop early even if feeling better - Recommend to start keep using Nasal Saline spray multiple times a day to help flush out congestion and clear sinuses - Improve hydration by drinking plenty of clear fluids (water, gatorade) to reduce secretions and thin congestion - Take Cough Syrup for now  Referral back to Pain Management as discussed  If you develop persistent fever >101F for at least 3 consecutive days, headaches with sinus pain or pressure or persistent earache, please schedule a follow-up evaluation within next few days to week.   Please schedule a follow-up appointment with Dr Parks Ranger in 3 to 6 months as needed  If you have any other questions or concerns, please feel free to call the clinic to contact me. You may also schedule an earlier appointment if necessary.  However, if your symptoms get significantly worse, please go to the Emergency Department to seek immediate medical attention.  Nobie Putnam, [place]

## 2015-09-17 NOTE — Telephone Encounter (Signed)
Will forward to PCP 

## 2015-11-20 ENCOUNTER — Ambulatory Visit: Payer: Medicare Other | Admitting: Physical Medicine & Rehabilitation

## 2016-04-15 ENCOUNTER — Encounter (HOSPITAL_COMMUNITY): Payer: Self-pay | Admitting: Family Medicine

## 2016-04-15 ENCOUNTER — Emergency Department (HOSPITAL_COMMUNITY): Payer: Medicare Other

## 2016-04-15 ENCOUNTER — Emergency Department (HOSPITAL_COMMUNITY)
Admission: EM | Admit: 2016-04-15 | Discharge: 2016-04-15 | Disposition: A | Discharge status: Home / Self Care | Payer: Medicare Other | Attending: Emergency Medicine | Admitting: Emergency Medicine

## 2016-04-15 ENCOUNTER — Emergency Department (HOSPITAL_BASED_OUTPATIENT_CLINIC_OR_DEPARTMENT_OTHER)
Admit: 2016-04-15 | Discharge: 2016-04-15 | Disposition: A | Discharge status: Home / Self Care | Payer: Medicare Other | Attending: Emergency Medicine | Admitting: Emergency Medicine

## 2016-04-15 DIAGNOSIS — I5032 Chronic diastolic (congestive) heart failure: Secondary | ICD-10-CM | POA: Insufficient documentation

## 2016-04-15 DIAGNOSIS — Z79899 Other long term (current) drug therapy: Secondary | ICD-10-CM | POA: Insufficient documentation

## 2016-04-15 DIAGNOSIS — E039 Hypothyroidism, unspecified: Secondary | ICD-10-CM | POA: Diagnosis not present

## 2016-04-15 DIAGNOSIS — N183 Chronic kidney disease, stage 3 (moderate): Secondary | ICD-10-CM | POA: Diagnosis not present

## 2016-04-15 DIAGNOSIS — R600 Localized edema: Secondary | ICD-10-CM | POA: Insufficient documentation

## 2016-04-15 DIAGNOSIS — I130 Hypertensive heart and chronic kidney disease with heart failure and stage 1 through stage 4 chronic kidney disease, or unspecified chronic kidney disease: Secondary | ICD-10-CM | POA: Diagnosis not present

## 2016-04-15 DIAGNOSIS — R609 Edema, unspecified: Secondary | ICD-10-CM

## 2016-04-15 DIAGNOSIS — M7989 Other specified soft tissue disorders: Secondary | ICD-10-CM | POA: Diagnosis present

## 2016-04-15 HISTORY — DX: Disorder of kidney and ureter, unspecified: N28.9

## 2016-04-15 HISTORY — DX: Disorder of thyroid, unspecified: E07.9

## 2016-04-15 LAB — CBC WITH DIFFERENTIAL/PLATELET
Basophils Absolute: 0.1 10*3/uL (ref 0.0–0.1)
Basophils Relative: 1 %
EOS ABS: 0.5 10*3/uL (ref 0.0–0.7)
Eosinophils Relative: 5 %
HCT: 36.1 % (ref 36.0–46.0)
Hemoglobin: 11.3 g/dL — ABNORMAL LOW (ref 12.0–15.0)
Lymphocytes Relative: 31 %
Lymphs Abs: 2.7 10*3/uL (ref 0.7–4.0)
MCH: 30.9 pg (ref 26.0–34.0)
MCHC: 31.3 g/dL (ref 30.0–36.0)
MCV: 98.6 fL (ref 78.0–100.0)
Monocytes Absolute: 0.4 10*3/uL (ref 0.1–1.0)
Monocytes Relative: 5 %
NEUTROS PCT: 58 %
Neutro Abs: 5.1 10*3/uL (ref 1.7–7.7)
Platelets: 252 10*3/uL (ref 150–400)
RBC: 3.66 MIL/uL — AB (ref 3.87–5.11)
RDW: 13.1 % (ref 11.5–15.5)
WBC: 8.7 10*3/uL (ref 4.0–10.5)

## 2016-04-15 LAB — BASIC METABOLIC PANEL
ANION GAP: 8 (ref 5–15)
BUN: 17 mg/dL (ref 6–20)
CO2: 21 mmol/L — ABNORMAL LOW (ref 22–32)
Calcium: 9.2 mg/dL (ref 8.9–10.3)
Chloride: 107 mmol/L (ref 101–111)
Creatinine, Ser: 1.58 mg/dL — ABNORMAL HIGH (ref 0.44–1.00)
GFR calc Af Amer: 33 mL/min — ABNORMAL LOW (ref >60)
GFR, EST NON AFRICAN AMERICAN: 28 mL/min — AB (ref >60)
GLUCOSE: 98 mg/dL (ref 65–99)
POTASSIUM: 3.8 mmol/L (ref 3.5–5.1)
Sodium: 136 mmol/L (ref 135–145)

## 2016-04-15 LAB — TROPONIN I: Troponin I: <0.03 ng/mL (ref <0.03)

## 2016-04-15 LAB — BRAIN NATRIURETIC PEPTIDE: B Natriuretic Peptide: 67 pg/mL (ref 0.0–100.0)

## 2016-04-15 NOTE — Progress Notes (Signed)
VASCULAR LAB PRELIMINARY  PRELIMINARY  PRELIMINARY  PRELIMINARY  Bilateral lower extremity venous duplex completed.    Preliminary report:  There is no DVT or SVT noted in the bilateral lower extremities.   Townsend Cudworth, RVT 04/15/2016, 6:37 PM

## 2016-04-15 NOTE — ED Provider Notes (Signed)
[facility] DEPT Provider Note   CSN: [id number] Arrival date & time: 04/15/16  1302  First Provider Contact:  First MD Initiated Contact with Patient 04/15/16 [place]        History   Chief Complaint Chief Complaint  Patient presents with  . Leg Swelling    HPI Emily Campos is a 82 y.o. female.  HPI Patient has been having problems with swelling of her lower legs which has worsened significantly over the past couple of days. She reports that the right leg has become much more swollen and there is mild swelling in the left. She is not expressing significant pain in association with this. She does report swelling about 10 years ago that was treated with Lasix. She does take regular Lasix but does not have routine significant lower extremity edema. She had increased her Lasix dose recently with no improvement. She denies shortness of breath. She does report however this morning she had a little bit of chest pain it was temporary and resolved. No recent cough, sputum or fevers. Past Medical History:  Diagnosis Date  . Chronic pain syndrome 1997  . HTN (hypertension)   . Renal disorder   . Sickle cell trait ([place])   . Thyroid disease     Patient Active Problem List   Diagnosis Date Noted  . Chronic rhinosinusitis 09/17/2015  . Chronic pain syndrome 09/17/2015  . Hypertensive heart disease 05/22/2015  . Hypertensive kidney and heart disease without congestive heart failure, stage III 05/22/2015  . Hyperlipidemia 03/19/2015  . CKD (chronic kidney disease) stage 3, GFR 30-59 ml/min 09/26/2014  . HTN (hypertension) 09/25/2014  . Hypothyroidism 09/25/2014  . Chronic back pain 09/25/2014  . Osteoarthritis of both knees 09/25/2014  . Chronic diastolic CHF (congestive heart failure), NYHA class 2 ([place]) 09/25/2014    Past Surgical History:  Procedure Laterality Date  . APPENDECTOMY    . BACK SURGERY  1997    OB History    No data available       Home Medications     Prior to Admission medications   Medication Sig Start Date End Date Taking? Authorizing Provider  amLODipine (NORVASC) 5 MG tablet Take 1 tablet (5 mg total) by mouth daily. 05/22/15   Fay Records, MD  amoxicillin-clavulanate (AUGMENTIN) 875-125 MG tablet Take 1 tablet by mouth 2 (two) times daily. 09/17/15   Olin Hauser, DO  cloNIDine (CATAPRES) 0.1 MG tablet Take 1 tablet (0.1 mg total) by mouth 3 (three) times daily. 03/19/15   Olin Hauser, DO  cyclobenzaprine (FLEXERIL) 5 MG tablet Take 1 tablet (5 mg total) by mouth 3 (three) times daily as needed for muscle spasms. 03/20/15   Devonne Doughty Karamalegos, DO  enalapril (VASOTEC) 20 MG tablet Take 1 tablet (20 mg total) by mouth daily. 03/31/15   Olin Hauser, DO  furosemide (LASIX) 20 MG tablet Take 0.5-1 tablets (10-20 mg total) by mouth daily. 03/19/15   Olin Hauser, DO  HYDROcodone-homatropine (HYCODAN) 5-1.5 MG/5ML syrup Take 5 mLs by mouth every 6 (six) hours as needed for cough. 09/17/15   Olin Hauser, DO  levothyroxine (SYNTHROID, LEVOTHROID) 100 MCG tablet Take 1 tablet (100 mcg total) by mouth daily before breakfast. 03/20/15   Olin Hauser, DO  potassium chloride (K-DUR) 10 MEQ tablet Take 1 tablet (10 mEq total) by mouth daily. 03/19/15   Olin Hauser, DO  pravastatin (PRAVACHOL) 40 MG tablet Take 1 tablet (40 mg total) by mouth at bedtime.  08/20/15   Olin Hauser, DO    Family History Family History  Problem Relation Age of Onset  . Heart disease Mother   . High blood pressure Mother     Social History Social History  Substance Use Topics  . Smoking status: Never Smoker  . Smokeless tobacco: Never Used  . Alcohol use No     Allergies   Review of patient's allergies indicates no known allergies.   Review of Systems Review of Systems 10 Systems reviewed and are negative for acute change except as noted in the HPI.   Physical Exam Updated  Vital Signs BP 145/71   Pulse (!) 59   Temp 98.7 F (37.1 C) (Oral)   Resp 18   SpO2 99%   Physical Exam  Constitutional: She is oriented to person, place, and time.  Patient is moderately deconditioned but she is alert and nontoxic. No distress.  HENT:  Head: Normocephalic and atraumatic.  Eyes: EOM are normal.  Neck: Neck supple.  Cardiovascular: Normal rate, regular rhythm, normal heart sounds and intact distal pulses.   Pulmonary/Chest: Effort normal. She has wheezes.  Patient does have a inspiratory wheeze centrally. No gross rhonchi or rail.  Abdominal: Soft.  Patient has moderately large, grapefruit-sized ventral wall hernia. This is nontender. No abdominal tenderness. Bilateral femoral pulses 2+ and symmetric.  Musculoskeletal:  3+ pitting edema right lower extremity. No erythema or cellulitis. 1+ pitting edema left ankle and foot. Calves are nontender.  Neurological: She is alert and oriented to person, place, and time. She exhibits normal muscle tone. Coordination normal.  Skin: Skin is warm and dry.  Psychiatric: She has a normal mood and affect.     ED Treatments / Results  Labs (all labs ordered are listed, but only abnormal results are displayed) Labs Reviewed  BASIC METABOLIC PANEL - Abnormal; Notable for the following:       Result Value   CO2 21 (*)    Creatinine, Ser 1.58 (*)    GFR calc non Af Amer 28 (*)    GFR calc Af Amer 33 (*)    All other components within normal limits  CBC WITH DIFFERENTIAL/PLATELET - Abnormal; Notable for the following:    RBC 3.66 (*)    Hemoglobin 11.3 (*)    All other components within normal limits  BRAIN NATRIURETIC PEPTIDE  TROPONIN I    EKG  EKG Interpretation None       Radiology Dg Chest 2 View  Result Date: 04/15/2016 CLINICAL DATA:  Pt has hx of COPD, CHF, HTN. Complaining of pain in her right leg with swelling. EXAM: CHEST  2 VIEW COMPARISON:  None. FINDINGS: Cardiac silhouette is normal in size. Aorta is  uncoiled. No mediastinal or hilar masses or evidence of adenopathy. Clear lungs.  No pleural effusion or pneumothorax. Skeletal structures are demineralized but grossly intact. IMPRESSION: No acute cardiopulmonary disease. Electronically Signed   By: Lajean Manes M.D.   On: 04/15/2016 18:33    Procedures Procedures (including critical care time)  Medications Ordered in ED Medications - No data to display   Initial Impression / Assessment and Plan / ED Course  I have reviewed the triage vital signs and the nursing notes.  Pertinent labs & imaging results that were available during my care of the patient were reviewed by me and considered in my medical decision making (see chart for details).  Clinical Course    Final Clinical Impressions(s) / ED Diagnoses   Final  diagnoses:  Peripheral edema  Patient has peripheral edema that does not show evidence of CHF or DVT. At this time, most consistent with venous insufficiency. Will counsel patient on elevating and a short course of Lasix burst. Patient is counseled to follow-up with family physician at the beginning of the week for reassessment.  New Prescriptions New Prescriptions   No medications on file     Charlesetta Shanks, MD 04/15/16 [phone]

## 2016-04-15 NOTE — ED Triage Notes (Signed)
Pt here for significant RLE swelling and some left. sts hx of same and treated with lasix. sts some pain.

## 2016-04-16 ENCOUNTER — Other Ambulatory Visit: Payer: Self-pay | Admitting: Family Medicine

## 2016-04-16 DIAGNOSIS — I10 Essential (primary) hypertension: Secondary | ICD-10-CM

## 2016-04-19 ENCOUNTER — Other Ambulatory Visit: Payer: Self-pay | Admitting: Family Medicine

## 2016-04-19 DIAGNOSIS — I10 Essential (primary) hypertension: Secondary | ICD-10-CM

## 2016-04-19 MED ORDER — POTASSIUM CHLORIDE ER 10 MEQ PO TBCR: 10.0000 meq | EXTENDED_RELEASE_TABLET | Freq: Every day | ORAL | 0 refills | Status: DC

## 2016-04-19 NOTE — Telephone Encounter (Signed)
Pt needs a refill on potassium. Please advise. Thanks! ep

## 2016-04-19 NOTE — Telephone Encounter (Signed)
Patient has appointment scheduled for 8/15 with Dr. Ardelia Mems.

## 2016-04-19 NOTE — Telephone Encounter (Signed)
Patient needs f/u appt.  One refill sent, but please have her schedule a f/u appt soon. Thanks!  Virginia Crews, MD, MPH PGY-3,  [facility] 04/19/2016 1:29 PM

## 2016-04-27 ENCOUNTER — Ambulatory Visit (INDEPENDENT_AMBULATORY_CARE_PROVIDER_SITE_OTHER): Payer: Medicare Other | Admitting: Family Medicine

## 2016-04-27 ENCOUNTER — Encounter: Payer: Self-pay | Admitting: Family Medicine

## 2016-04-27 VITALS — BP 142/72 | HR 86 | Temp 98.4°F | Ht <= 58 in | Wt 169.4 lb

## 2016-04-27 DIAGNOSIS — R600 Localized edema: Secondary | ICD-10-CM | POA: Diagnosis not present

## 2016-04-27 DIAGNOSIS — I10 Essential (primary) hypertension: Secondary | ICD-10-CM | POA: Diagnosis not present

## 2016-04-27 DIAGNOSIS — G8929 Other chronic pain: Secondary | ICD-10-CM

## 2016-04-27 DIAGNOSIS — D649 Anemia, unspecified: Secondary | ICD-10-CM

## 2016-04-27 DIAGNOSIS — M549 Dorsalgia, unspecified: Secondary | ICD-10-CM | POA: Diagnosis not present

## 2016-04-27 DIAGNOSIS — I5032 Chronic diastolic (congestive) heart failure: Secondary | ICD-10-CM | POA: Diagnosis not present

## 2016-04-27 DIAGNOSIS — E038 Other specified hypothyroidism: Secondary | ICD-10-CM

## 2016-04-27 DIAGNOSIS — E785 Hyperlipidemia, unspecified: Secondary | ICD-10-CM

## 2016-04-27 LAB — COMPLETE METABOLIC PANEL WITH GFR
ALT: 9 U/L (ref 6–29)
AST: 16 U/L (ref 10–35)
Albumin: 4.2 g/dL (ref 3.6–5.1)
Alkaline Phosphatase: 98 U/L (ref 33–130)
BILIRUBIN TOTAL: 0.4 mg/dL (ref 0.2–1.2)
BUN: 17 mg/dL (ref 7–25)
CO2: 25 mmol/L (ref 20–31)
Calcium: 9.3 mg/dL (ref 8.6–10.4)
Chloride: 105 mmol/L (ref 98–110)
Creat: 1.55 mg/dL — ABNORMAL HIGH (ref 0.60–0.88)
GFR, EST NON AFRICAN AMERICAN: 30 mL/min — AB (ref >=60)
GFR, Est African American: 34 mL/min — ABNORMAL LOW (ref >=60)
GLUCOSE: 89 mg/dL (ref 65–99)
Potassium: 4.4 mmol/L (ref 3.5–5.3)
SODIUM: 139 mmol/L (ref 135–146)
TOTAL PROTEIN: 7.2 g/dL (ref 6.1–8.1)

## 2016-04-27 LAB — FERRITIN: Ferritin: 246 ng/mL (ref 20–288)

## 2016-04-27 LAB — CBC
HCT: 34.3 % — ABNORMAL LOW (ref 35.0–45.0)
Hemoglobin: 11.1 g/dL — ABNORMAL LOW (ref 11.7–15.5)
MCH: 31.5 pg (ref 27.0–33.0)
MCHC: 32.4 g/dL (ref 32.0–36.0)
MCV: 97.4 fL (ref 80.0–100.0)
MPV: 9.2 fL (ref 7.5–12.5)
Platelets: 281 10*3/uL (ref 140–400)
RBC: 3.52 MIL/uL — AB (ref 3.80–5.10)
RDW: 13.3 % (ref 11.0–15.0)
WBC: 7.9 10*3/uL (ref 3.8–10.8)

## 2016-04-27 LAB — IRON AND TIBC
%SAT: 26 % (ref 11–50)
Iron: 60 ug/dL (ref 45–160)
TIBC: 229 ug/dL — AB (ref 250–450)
UIBC: 169 ug/dL (ref 125–400)

## 2016-04-27 LAB — TSH: TSH: 0.4 mIU/L

## 2016-04-27 MED ORDER — PRAVASTATIN SODIUM 40 MG PO TABS: 40.0000 mg | ORAL_TABLET | Freq: Every day | ORAL | 3 refills | Status: DC

## 2016-04-27 MED ORDER — POTASSIUM CHLORIDE ER 10 MEQ PO TBCR: 10.0000 meq | EXTENDED_RELEASE_TABLET | Freq: Every day | ORAL | 3 refills | Status: DC

## 2016-04-27 MED ORDER — AMLODIPINE BESYLATE 5 MG PO TABS: 5.0000 mg | ORAL_TABLET | Freq: Every day | ORAL | 3 refills | Status: DC

## 2016-04-27 MED ORDER — CYCLOBENZAPRINE HCL 5 MG PO TABS: 5.0000 mg | ORAL_TABLET | Freq: Every day | ORAL | 2 refills | Status: DC | PRN

## 2016-04-27 MED ORDER — LEVOTHYROXINE SODIUM 100 MCG PO TABS: 100.0000 ug | ORAL_TABLET | Freq: Every day | ORAL | 3 refills | Status: DC

## 2016-04-27 MED ORDER — FUROSEMIDE 20 MG PO TABS: 40.0000 mg | ORAL_TABLET | Freq: Every day | ORAL | 3 refills | Status: DC

## 2016-04-27 MED ORDER — ENALAPRIL MALEATE 20 MG PO TABS: 20.0000 mg | ORAL_TABLET | Freq: Every day | ORAL | 3 refills | Status: DC

## 2016-04-27 MED ORDER — CLONIDINE HCL 0.1 MG PO TABS: 0.1000 mg | ORAL_TABLET | Freq: Three times a day (TID) | ORAL | 3 refills | Status: DC

## 2016-04-27 NOTE — Progress Notes (Addendum)
Date of Visit: 04/27/2016   HPI:  Patient presents for ED follow up. Was seen in ED on 8/3 with lower extremity edema, R worse than L. Had ultrasound done to rule out DVT, which was negative. Swelling has been ongoing for 2-3 weeks. Usually R leg is worse than L, per patient. Has gone down some. Patient endorses eating foods with high salt contents. In ED lasix was increased to 40mg /day. Tolerating this well. No feve,r shortness of breath, blood in stool. Does endorse very occasional chest pain lasting just a few minutes, not worse with exertion or associated with sweating, nausa, or shortness of breath. No chest pain currently.  Hypothyroidism - due for TSH check  Hyperlipidemia  - due for lipids, but not fasting today  ROS: See HPI.  [place]: history of chronic pain, hypertension, CKD3, hyperlipidemia, hypothyroidism, chronic diastolic CHF  PHYSICAL EXAM: BP (!) 142/72   Pulse 86   Temp 98.4 F (36.9 C) (Oral)   Ht 4\' 3"  (1.295 m)   Wt 169 lb 6.4 oz (76.8 kg)   BMI 45.79 kg/m   Gen: NAD, pleasant, cooperative HEENT: normocephalic, atraumatic, moist mucous membranes  Heart: regular rate and rhythm, no murmur Lungs: clear to auscultation bilaterally, normal work of breathing  Neuro: alert, grossly nonfocal, speech normal Ext: R> L bilateral lower extremity edema. No redness or tenderness over either leg.   ASSESSMENT/PLAN:  Edema of right lower extremity Most likely cause at this time is asymmetric venous insufficiency given prior episodes of leg swelling in R leg. ED workup negative for DVT. Other possible causes include anasarca from anemia or low albumin. Doubt CHF given lack of weight gain or respiratory symptoms. No preceding injuries. Other thought would be whether amlodipine is contributing. - Mildly low hgb noted in ED, will repeat today and check iron studies.  - check CMET today to monitor renal function while on increased lasix dose, also check albumin - update TSH to  ensure not contributing, due for this anyways - trial of compression hose, 20-29mmg strength - follow up in 3 weeks if not improved/resolved. Consider trial off amlodipine at that time. - patient agreeable to this plan.  HTN (hypertension) Well controlled. Continue current regimen. Check renal function & electrolytes today.   Hypothyroidism Check TSH today, will titrate synthroid pending results.  Chronic back pain Did not discuss in detail. Requested refill of flexeril along with other medications. Uses this occasionally with good relief. Refill sent in.  Chronic diastolic CHF (congestive heart failure), NYHA class 2 Edema seems more peripheral at present, no shortness of breath. Continue lasix at 40mg /day, will check renal function and electrolytes today.  Hyperlipidemia nonfasting so unable to check lipids. Continue pravastatin.  CP described by patient is noncardiac in description. Reports has seen cardiology for this pain in the past, and was deemed ok. Discussed ED precautions for chest pain.   FOLLOW UP: Follow up in 3 weeks if not improved, otherwise see new PCP in 2 months.   [place]. Ardelia Mems, [place]

## 2016-04-27 NOTE — Assessment & Plan Note (Signed)
Check TSH today, will titrate synthroid pending results.

## 2016-04-27 NOTE — Assessment & Plan Note (Addendum)
Most likely cause at this time is asymmetric venous insufficiency given prior episodes of leg swelling in R leg. ED workup negative for DVT. Other possible causes include anasarca from anemia or low albumin. Doubt CHF given lack of weight gain or respiratory symptoms. No preceding injuries. Other thought would be whether amlodipine is contributing. - Mildly low hgb noted in ED, will repeat today and check iron studies.  - check CMET today to monitor renal function while on increased lasix dose, also check albumin - update TSH to ensure not contributing, due for this anyways - trial of compression hose, 20-66mmg strength - follow up in 3 weeks if not improved/resolved. Consider trial off amlodipine at that time. - patient agreeable to this plan.

## 2016-04-27 NOTE — Assessment & Plan Note (Signed)
Edema seems more peripheral at present, no shortness of breath. Continue lasix at 40mg /day, will check renal function and electrolytes today.

## 2016-04-27 NOTE — Assessment & Plan Note (Signed)
Did not discuss in detail. Requested refill of flexeril along with other medications. Uses this occasionally with good relief. Refill sent in.

## 2016-04-27 NOTE — Patient Instructions (Signed)
Get 20-36mmHg compression hose Can buy these at a medical supply store  Checking labs today: kidneys, liver, blood counts, iron levels, thyroid  I will call you or send you a letter with your results  I sent in refills of your medicines for you.  If your swelling is not better with the compression hose please return to see [place] within 3 weeks.  Follow up with Dr. Brita Romp, your new doctor, in about 2 months, sooner if needed.  Be well, Dr. Ardelia Mems

## 2016-04-27 NOTE — Assessment & Plan Note (Signed)
nonfasting so unable to check lipids. Continue pravastatin.

## 2016-04-27 NOTE — Assessment & Plan Note (Signed)
Well controlled. Continue current regimen. Check renal function & electrolytes today.

## 2016-04-30 ENCOUNTER — Encounter: Payer: Self-pay | Admitting: Family Medicine

## 2016-07-01 ENCOUNTER — Ambulatory Visit: Payer: Medicare Other | Admitting: Family Medicine

## 2016-07-02 ENCOUNTER — Ambulatory Visit: Payer: Medicare Other | Admitting: Obstetrics and Gynecology

## 2016-07-12 ENCOUNTER — Telehealth: Payer: Self-pay | Admitting: *Deleted

## 2016-07-12 NOTE — Telephone Encounter (Signed)
Called to offer to schedule AWV. Patient declined at this time but states she may schedule in future.  Hubbard Hartshorn, RN, BSN

## 2016-10-05 ENCOUNTER — Ambulatory Visit (INDEPENDENT_AMBULATORY_CARE_PROVIDER_SITE_OTHER): Payer: Medicare Other | Admitting: Family Medicine

## 2016-10-05 ENCOUNTER — Encounter: Payer: Self-pay | Admitting: Family Medicine

## 2016-10-05 VITALS — BP 126/84 | HR 71 | Temp 98.1°F | Ht <= 58 in | Wt 151.8 lb

## 2016-10-05 DIAGNOSIS — J029 Acute pharyngitis, unspecified: Secondary | ICD-10-CM

## 2016-10-05 DIAGNOSIS — K219 Gastro-esophageal reflux disease without esophagitis: Secondary | ICD-10-CM | POA: Diagnosis not present

## 2016-10-05 LAB — POCT RAPID STREP A (OFFICE): RAPID STREP A SCREEN: NEGATIVE

## 2016-10-05 MED ORDER — FLUTICASONE PROPIONATE 50 MCG/ACT NA SUSP: 2.0000 | Freq: Every day | NASAL | 6 refills | Status: DC

## 2016-10-05 MED ORDER — RANITIDINE HCL 150 MG PO TABS: 150.0000 mg | ORAL_TABLET | Freq: Two times a day (BID) | ORAL | 2 refills | Status: DC

## 2016-10-05 NOTE — Patient Instructions (Addendum)
Nice to meet you today. For your reflux pain, you can start taking Zantac twice daily. Your sore throat may be related to the reflux or to seasonal allergies. You can try Flonase nose spray daily for this.  I like to see you back in one month's follow-up on how these medications are doing. If you have any unrelenting abdominal pain, fevers, vomiting, please seek medical care.  Take care, Dr. B   Heartburn Heartburn is a type of pain or discomfort that can happen in the throat or chest. It is often described as a burning pain. It may also cause a bad taste in the mouth. Heartburn may feel worse when you lie down or bend over, and it is often worse at night. Heartburn may be caused by stomach contents that move back up into the esophagus (reflux). Follow these instructions at home: Take these actions to decrease your discomfort and to help avoid complications. Diet  Follow a diet as recommended by your health care provider. This may involve avoiding foods and drinks such as:  Coffee and tea (with or without caffeine).  Drinks that contain alcohol.  Energy drinks and sports drinks.  Carbonated drinks or sodas.  Chocolate and cocoa.  Peppermint and mint flavorings.  Garlic and onions.  Horseradish.  Spicy and acidic foods, including peppers, chili powder, curry powder, vinegar, hot sauces, and barbecue sauce.  Citrus fruit juices and citrus fruits, such as oranges, lemons, and limes.  Tomato-based foods, such as red sauce, chili, salsa, and pizza with red sauce.  Fried and fatty foods, such as donuts, french fries, potato chips, and high-fat dressings.  High-fat meats, such as hot dogs and fatty cuts of red and white meats, such as rib eye steak, sausage, ham, and bacon.  High-fat dairy items, such as whole milk, butter, and cream cheese.  Eat small, frequent meals instead of large meals.  Avoid drinking large amounts of liquid with your meals.  Avoid eating meals during  the 2-3 hours before bedtime.  Avoid lying down right after you eat.  Do not exercise right after you eat. General instructions  Pay attention to any changes in your symptoms.  Take over-the-counter and prescription medicines only as told by your health care provider. Do not take aspirin, ibuprofen, or other NSAIDs unless your health care provider told you to do so.  Do not use any tobacco products, including cigarettes, chewing tobacco, and e-cigarettes. If you need help quitting, ask your health care provider.  Wear loose-fitting clothing. Do not wear anything tight around your waist that causes pressure on your abdomen.  Raise (elevate) the head of your bed about 6 inches (15 cm).  Try to reduce your stress, such as with yoga or meditation. If you need help reducing stress, ask your health care provider.  If you are overweight, reduce your weight to an amount that is healthy for you. Ask your health care provider for guidance about a safe weight loss goal.  Keep all follow-up visits as told by your health care provider. This is important. Contact a health care provider if:  You have new symptoms.  You have unexplained weight loss.  You have difficulty swallowing, or it hurts to swallow.  You have wheezing or a persistent cough.  Your symptoms do not improve with treatment.  You have frequent heartburn for more than two weeks. Get help right away if:  You have pain in your arms, neck, jaw, teeth, or back.  You feel sweaty, dizzy,  or light-headed.  You have chest pain or shortness of breath.  You vomit and your vomit looks like blood or coffee grounds.  Your stool is bloody or black. This information is not intended to replace advice given to you by your health care provider. Make sure you discuss any questions you have with your health care provider. Document Released: 01/16/2009 Document Revised: 02/05/2016 Document Reviewed: 12/25/2014 Elsevier Interactive Patient  Education  2017 [employer].

## 2016-10-05 NOTE — Progress Notes (Signed)
   Subjective:   Clatie Wasko is a 83 y.o. female with a history of HTN, CHF, hypothyroidism, CKD3, chronic pain syndrome here for same day appointment for sore throat  GERD, sore throat - reports burning reflux that occurs with eating for approximately the last 6 months - No chest pain, no abdominal pain, no shortness of breath - Reports that she is typically a good eater but this has caused her to have a decreased appetite - Denies weight loss, difficulty swallowing, sputum production, dizziness - Also has had a sore throat for several months that she thinks may be related to her heartburn or possibly related to her intermittent cough, runny nose, postnasal drip - She does not take any medications for allergic rhinitis or GERD - She will occasionally take cough medicine   Review of Systems:  Per HPI.   Social History: Never smoker  Objective:  BP 126/84 (BP Location: Left Arm, Patient Position: Sitting, Cuff Size: Normal)   Pulse 71   Temp 98.1 F (36.7 C) (Oral)   Ht 4\' 3"  (1.295 m)   Wt 151 lb 12.8 oz (68.9 kg)   SpO2 95%   BMI 41.03 kg/m   Gen:  83 y.o. female in NAD, Sitting comfortably in a wheelchair HEENT: NCAT, MMM, EOMI, PERRL, anicteric sclerae, OP clear, TMs clear bilaterally Neck: Supple, no LAD, no thyromegaly CV: RRR, no MRG Resp: Non-labored, CTAB, no wheezes noted Abd: Soft, NTND, BS present, no guarding or organomegaly Ext: WWP, no edema MSK: Able to move all 4 extremities Neuro: Alert and oriented, speech normal      Assessment & Plan:     Henslee Allton is a 83 y.o. female here for   GERD (gastroesophageal reflux disease) Describe symptoms sound consistent with GERD Discussed dietary changes that can decrease reflux Trial of Zantac twice a day Return precautions discussed Follow-up in one month  Sore throat Could be related to GERD as above, but sounds likely related to allergic rhinitis and postnasal drip Trial of Zantac as  above Also try Flonase Before evaluation, patient was swabbed with a rapid strep test that was negative Doubt infection as this has been ongoing for months Follow-up in one month    Virginia Crews, MD MPH PGY-3,  [facility] 10/06/2016  1:36 PM

## 2016-10-06 NOTE — Assessment & Plan Note (Signed)
Could be related to GERD as above, but sounds likely related to allergic rhinitis and postnasal drip Trial of Zantac as above Also try Flonase Before evaluation, patient was swabbed with a rapid strep test that was negative Doubt infection as this has been ongoing for months Follow-up in one month

## 2016-10-06 NOTE — Assessment & Plan Note (Signed)
Describe symptoms sound consistent with GERD Discussed dietary changes that can decrease reflux Trial of Zantac twice a day Return precautions discussed Follow-up in one month

## 2017-02-03 ENCOUNTER — Telehealth: Payer: Self-pay | Admitting: Family Medicine

## 2017-02-03 NOTE — Telephone Encounter (Signed)
Asked if she would like to schedule an AWV and she declined. Let her know she could call back and schedule should she change her mind. - Emily Campos

## 2017-02-11 ENCOUNTER — Encounter (HOSPITAL_COMMUNITY): Payer: Self-pay

## 2017-02-11 DIAGNOSIS — J069 Acute upper respiratory infection, unspecified: Secondary | ICD-10-CM | POA: Diagnosis not present

## 2017-02-11 DIAGNOSIS — I130 Hypertensive heart and chronic kidney disease with heart failure and stage 1 through stage 4 chronic kidney disease, or unspecified chronic kidney disease: Secondary | ICD-10-CM | POA: Insufficient documentation

## 2017-02-11 DIAGNOSIS — Z79899 Other long term (current) drug therapy: Secondary | ICD-10-CM | POA: Diagnosis not present

## 2017-02-11 DIAGNOSIS — N183 Chronic kidney disease, stage 3 (moderate): Secondary | ICD-10-CM | POA: Insufficient documentation

## 2017-02-11 DIAGNOSIS — I5032 Chronic diastolic (congestive) heart failure: Secondary | ICD-10-CM | POA: Diagnosis not present

## 2017-02-11 DIAGNOSIS — R2241 Localized swelling, mass and lump, right lower limb: Secondary | ICD-10-CM | POA: Insufficient documentation

## 2017-02-11 DIAGNOSIS — E039 Hypothyroidism, unspecified: Secondary | ICD-10-CM | POA: Insufficient documentation

## 2017-02-11 NOTE — ED Triage Notes (Signed)
Pt complaining of R leg swelling. Pt denies any pain or SOB. Pt unsure if hx of same. Pt denies any leg pain.

## 2017-02-12 ENCOUNTER — Ambulatory Visit (HOSPITAL_BASED_OUTPATIENT_CLINIC_OR_DEPARTMENT_OTHER)
Admission: RE | Admit: 2017-02-12 | Discharge: 2017-02-12 | Disposition: A | Discharge status: Home / Self Care | Payer: Medicare Other | Source: Ambulatory Visit | Attending: Emergency Medicine | Admitting: Emergency Medicine

## 2017-02-12 ENCOUNTER — Emergency Department (HOSPITAL_COMMUNITY)
Admission: EM | Admit: 2017-02-12 | Discharge: 2017-02-12 | Disposition: A | Discharge status: Home / Self Care | Payer: Medicare Other | Attending: Emergency Medicine | Admitting: Emergency Medicine

## 2017-02-12 DIAGNOSIS — M7989 Other specified soft tissue disorders: Secondary | ICD-10-CM

## 2017-02-12 DIAGNOSIS — J069 Acute upper respiratory infection, unspecified: Secondary | ICD-10-CM

## 2017-02-12 DIAGNOSIS — B9789 Other viral agents as the cause of diseases classified elsewhere: Secondary | ICD-10-CM

## 2017-02-12 DIAGNOSIS — R600 Localized edema: Secondary | ICD-10-CM

## 2017-02-12 DIAGNOSIS — R609 Edema, unspecified: Secondary | ICD-10-CM | POA: Diagnosis not present

## 2017-02-12 MED ORDER — BENZONATATE 100 MG PO CAPS: 100.0000 mg | ORAL_CAPSULE | Freq: Three times a day (TID) | ORAL | 0 refills | Status: DC

## 2017-02-12 NOTE — Discharge Instructions (Signed)
IMPORTANT PATIENT INSTRUCTIONS:  ° °You have been scheduled for an Outpatient Vascular Study at [facility].   ° °If tomorrow is a Saturday or Sunday, please go to the [facility] registration desk at 8 AM tomorrow morning and tell them you are therefore a vascular study. ° °If tomorrow is a weekday (Monday - Friday), please go to the  Admitting Department at 8 AM and tell them you are therefore a vascular study ° ° °

## 2017-02-12 NOTE — Progress Notes (Signed)
**  Preliminary report by tech**  Right lower extremity venous duplex complete. There is no evidence of deep or superficial vein thrombosis involving the right lower extremity. All clearly visualized vessels appear patent and compressible. There is no evidence of a Baker's cyst on the right.  02/12/17 10:38 AM Emily Campos RVT

## 2017-02-12 NOTE — ED Provider Notes (Signed)
[facility] DEPT Provider Note   CSN: [id number] Arrival date & time: 02/11/17  2151 By signing my name below, I, Dyke Brackett, attest that this documentation has been prepared under the direction and in the presence of Jola Schmidt, MD . Electronically Signed: Dyke Brackett, Scribe. 02/12/2017. 2:52 AM.   History   Chief Complaint Chief Complaint  Patient presents with  . Leg Swelling    HPI Emily Campos is a 83 y.o. female with a history of edema of RLE, HTN, diastolic CHF, chronic pain syndrome and thyroid disease who presents to the Emergency Department complaining of gradually worsening right lateral leg swelling onset two weeks ago. Per pt, her right leg is bigger than her left leg at baseline, but states this is the largest it has ever been. Pt was seen for the same on 04/15/16 where is was suspected that RLE edema was due to venus insufficiency. She has not worn compression stockings at home. No alleviating or modifying factors noted.  No OTC treatments tried for these symptoms PTA. Pt also reports persistent sore throat and cough which began in 01/18. No personal hx of DVT. Pt denies any leg pain, chest pain, or SOB.   The history is provided by the patient and medical records. No language interpreter was used.    Past Medical History:  Diagnosis Date  . Chronic pain syndrome 1997  . HTN (hypertension)   . Renal disorder   . Sickle cell trait ([place])   . Thyroid disease     Patient Active Problem List   Diagnosis Date Noted  . Sore throat 10/05/2016  . GERD (gastroesophageal reflux disease) 10/05/2016  . Edema of right lower extremity 04/27/2016  . Chronic rhinosinusitis 09/17/2015  . Chronic pain syndrome 09/17/2015  . Hypertensive heart disease 05/22/2015  . Hypertensive kidney and heart disease without congestive heart failure, stage III 05/22/2015  . Hyperlipidemia 03/19/2015  . CKD (chronic kidney disease) stage 3, GFR 30-59 ml/min 09/26/2014  . HTN  (hypertension) 09/25/2014  . Hypothyroidism 09/25/2014  . Chronic back pain 09/25/2014  . Osteoarthritis of both knees 09/25/2014  . Chronic diastolic CHF (congestive heart failure), NYHA class 2 ([place]) 09/25/2014    Past Surgical History:  Procedure Laterality Date  . APPENDECTOMY    . BACK SURGERY  1997    OB History    No data available       Home Medications    Prior to Admission medications   Medication Sig Start Date End Date Taking? Authorizing Provider  amLODipine (NORVASC) 5 MG tablet Take 1 tablet (5 mg total) by mouth daily. 04/27/16   Leeanne Rio, MD  cloNIDine (CATAPRES) 0.1 MG tablet Take 1 tablet (0.1 mg total) by mouth 3 (three) times daily. 04/27/16   Leeanne Rio, MD  cyclobenzaprine (FLEXERIL) 5 MG tablet Take 1 tablet (5 mg total) by mouth daily as needed for muscle spasms. 04/27/16   Leeanne Rio, MD  enalapril (VASOTEC) 20 MG tablet Take 1 tablet (20 mg total) by mouth daily. 04/27/16   Leeanne Rio, MD  fluticasone (FLONASE) 50 MCG/ACT nasal spray Place 2 sprays into both nostrils daily. 10/05/16   Bacigalupo, Dionne Bucy, MD  furosemide (LASIX) 20 MG tablet Take 2 tablets (40 mg total) by mouth daily. 04/27/16   Leeanne Rio, MD  levothyroxine (SYNTHROID, LEVOTHROID) 100 MCG tablet Take 1 tablet (100 mcg total) by mouth daily before breakfast. 04/27/16   Leeanne Rio, MD  potassium chloride (K-DUR) 10  MEQ tablet Take 1 tablet (10 mEq total) by mouth daily. 04/27/16   Leeanne Rio, MD  pravastatin (PRAVACHOL) 40 MG tablet Take 1 tablet (40 mg total) by mouth at bedtime. 04/27/16   Leeanne Rio, MD  ranitidine (ZANTAC) 150 MG tablet Take 1 tablet (150 mg total) by mouth 2 (two) times daily. 10/05/16   Virginia Crews, MD    Family History Family History  Problem Relation Age of Onset  . Heart disease Mother   . High blood pressure Mother     Social History Social History  Substance Use Topics  .  Smoking status: Never Smoker  . Smokeless tobacco: Never Used  . Alcohol use No     Allergies   Patient has no known allergies.   Review of Systems Review of Systems All systems reviewed and are negative for acute change except as noted in the HPI.  Physical Exam Updated Vital Signs BP (!) 144/70   Pulse (!) 49   Temp 98.1 F (36.7 C)   Resp 16   SpO2 100%   Physical Exam  Constitutional: She is oriented to person, place, and time. She appears well-developed and well-nourished.  HENT:  Head: Normocephalic.  Eyes: EOM are normal.  Neck: Normal range of motion.  Pulmonary/Chest: Effort normal and breath sounds normal. No respiratory distress. She has no wheezes. She has no rales. She exhibits no tenderness.  Abdominal: She exhibits no distension.  Musculoskeletal: Normal range of motion. She exhibits edema.  2+ edema RLE with normal PT and DP pulses  Neurological: She is alert and oriented to person, place, and time.  Psychiatric: She has a normal mood and affect.  Nursing note and vitals reviewed.   ED Treatments / Results  DIAGNOSTIC STUDIES:  Oxygen Saturation is 100% on RA, normal by my interpretation.    COORDINATION OF CARE:  2:48 AM Discussed treatment plan which includes compression stockings with pt at bedside and pt agreed to plan.   Labs (all labs ordered are listed, but only abnormal results are displayed) Labs Reviewed - No data to display  EKG  EKG Interpretation None       Radiology No results found.  Procedures Procedures (including critical care time)  Medications Ordered in ED Medications - No data to display   Initial Impression / Assessment and Plan / ED Course  I have reviewed the triage vital signs and the nursing notes.  Pertinent labs & imaging results that were available during my care of the patient were reviewed by me and considered in my medical decision making (see chart for details).     Patient has intermittent  recurrent swelling of her right lower extremity.  She will be send the morning for duplex of her right lower extremity to rule out DVT.  My suspicion hours so low that I think that her risk of anticoagulation is too high at this time to give single dose Lovenox.  Follow-up for duplex in the morning.  Recommended elevation and compression stockings  Final Clinical Impressions(s) / ED Diagnoses   Final diagnoses:  Edema of right lower extremity  Viral URI with cough    New Prescriptions New Prescriptions   No medications on file   I personally performed the services described in this documentation, which was scribed in my presence. The recorded information has been reviewed and is accurate.        Jola Schmidt, MD 02/12/17 [id number]

## 2017-02-28 ENCOUNTER — Encounter: Payer: Self-pay | Admitting: Family Medicine

## 2017-02-28 ENCOUNTER — Ambulatory Visit (INDEPENDENT_AMBULATORY_CARE_PROVIDER_SITE_OTHER): Payer: Medicare Other | Admitting: Family Medicine

## 2017-02-28 VITALS — BP 132/84 | HR 79 | Temp 97.7°F | Ht <= 58 in | Wt 160.2 lb

## 2017-02-28 DIAGNOSIS — R600 Localized edema: Secondary | ICD-10-CM | POA: Diagnosis not present

## 2017-02-28 DIAGNOSIS — I10 Essential (primary) hypertension: Secondary | ICD-10-CM

## 2017-02-28 NOTE — Patient Instructions (Signed)
Nice to see you. Continue elevating your legs.  Stop your amlodipine.  We are getting some labs today to check for causes of swelling.  This is not a blood clot.  Follow-up in 1 week.  Watch the sodium in your food.  Take care, Dr. Jacinto Reap   Cooking With Less Salt Cooking with less salt is one way to reduce the amount of sodium you get from food. Depending on your condition and overall health, your health care provider or diet and nutrition specialist (dietitian) may recommend that you reduce your sodium intake. Most people should have less than 2,300 milligrams (mg) of sodium each day. If you have high blood pressure (hypertension), you may need to limit your sodium to 1,500 mg each day. Follow the tips below to help reduce your sodium intake. What do I need to know about cooking with less salt? Shopping  Buy sodium-free or low-sodium products. Look for the following words on food labels: ? Low-sodium. ? Sodium-free. ? Reduced-sodium. ? No salt added. ? Unsalted.  Buy fresh or frozen vegetables. Avoid canned vegetables.  Avoid buying meats or protein foods that have been injected with broth or saline solution.  Avoid cured or smoked meats, such as hot dogs, bacon, salami, ham, and bologna. Reading food labels  Check the food label before buying or using packaged ingredients.  Look for products with no more than 140 mg of sodium in one serving.  Do not choose foods with salt as one of the first three ingredients on the ingredients list. If salt is one of the first three ingredients, it usually means the item is high in sodium, because ingredients are listed in order of amount in the food item. Cooking  Use herbs, seasonings without salt, and spices as substitutes for salt in foods.  Use sodium-free baking soda when baking.  Grill, braise, or roast foods to add flavor with less salt.  Avoid adding salt to pasta, rice, or hot cereals while cooking.  Drain and rinse canned vegetables  before use.  Avoid adding salt when cooking sweets and desserts.  Cook with low-sodium ingredients. What are some salt alternatives? The following are herbs, seasonings, and spices that can be used instead of salt to give taste to your food. Herbs should be fresh or dried. Do not choose packaged mixes. Next to the name of the herb, spice, or seasoning are some examples of foods you can pair it with. Herbs  Bay leaves - Soups, meat and vegetable dishes, and spaghetti sauce.  Basil - [employer], soups, pasta, and fish dishes.  Cilantro - Meat, poultry, and vegetable dishes.  Chili powder - Marinades and Mexican dishes.  Chives - Salad dressings and potato dishes.  Cumin - Mexican dishes, couscous, and meat dishes.  Dill - Fish dishes, sauces, and salads.  Fennel - Meat and vegetable dishes, breads, and cookies.  Garlic (do not use garlic salt) - [place] dishes, meat dishes, salad dressings, and sauces.  Marjoram - Soups, potato dishes, and meat dishes.  Oregano - Pizza and spaghetti sauce.  Parsley - Salads, soups, pasta, and meat dishes.  Rosemary - [place] dishes, salad dressings, soups, and red meats.  Saffron - Fish dishes, pasta, and some poultry dishes.  Sage - Stuffings and sauces.  Tarragon - Fish and [employer].  Thyme - Stuffing, meat, and fish dishes. Seasonings  Lemon juice - Fish dishes, poultry dishes, vegetables, and salads.  Vinegar - Salad dressings, vegetables, and fish dishes. Spices  Cinnamon -  Sweet dishes, such as cakes, cookies, and puddings.  Cloves - Gingerbread, puddings, and marinades for meats.  Curry - Vegetable dishes, fish and poultry dishes, and stir-fry dishes.  Ginger - Vegetables dishes, fish dishes, and stir-fry dishes.  Nutmeg - Pasta, vegetables, poultry, fish dishes, and custard. What are some low-sodium ingredients and foods?  Fresh or frozen fruits and vegetables with no sauce added.  Fresh or frozen whole  meats, poultry, and fish with no sauce added.  Eggs.  Noodles, pasta, quinoa, rice.  Shredded or puffed wheat or puffed rice.  Regular or quick oats.  Milk, yogurt, hard cheeses, and low-sodium cheeses. Good cheese choices include Swiss, [place]. Always check the label for the serving size and sodium content.  Unsalted butter or margarine.  Unsalted nuts.  Sherbet or ice cream (keep to  cup per serving).  Homemade pudding.  Sodium-free baking soda and baking powder. This is not a complete list of low-sodium ingredients and foods. Contact your dietitian for more options. Summary  Cooking with less salt is one way to reduce the amount of sodium that you get from food.  Buy sodium-free or low-sodium products.  Check the food label before using or buying packaged ingredients.  Use herbs, seasonings without salt, and spices as substitutes for salt in foods. This information is not intended to replace advice given to you by your health care provider. Make sure you discuss any questions you have with your health care provider. Document Released: 08/30/2005 Document Revised: 09/07/2016 Document Reviewed: 09/07/2016 Elsevier Interactive Patient Education  2017 [employer].

## 2017-02-28 NOTE — Progress Notes (Signed)
Subjective:   Emily Campos is a 83 y.o. female with a history of HTN, diastolic heart failure, GERD, CK D3, chronic pain syndrome here for  Chief Complaint  Patient presents with  . Leg Swelling    Patient has a chronic intermittent history of lower extremity edema that is worse on the right than the left. She's been previously seen 04/15/16, 04/27/16, and most recently in the emergency department on 02/12/17 for right lower extremity edema.  LE venous duplex negative for DVT.  No history of VTE. She had an Echo in 10/2014 that showed EF 60-65% and grade 1 diastolic dysfunction.  She reports that her right leg is swollen for one month and continues to get larger. She has stopped eating cheese that she thought this was the only thing her diet with salt in it. She reports the swelling has never beenbefore. She is taking Lasix 40 mEq daily for the last 2 weeks and it has not seemed to help. Before this, she was not taking it as prescribed and only taking it some days. She is also increased her potassium supplement 20 mEq daily from 10 because she thought low potassium may contribute to her swelling. She denies any fevers, shortness breath, calf tenderness, redness, rash. She was using compression stockings, but is no longer able to fit them over her right lower extremity. She is elevating her feet anytime she is sitting at home.  Review of Systems:  Per HPI.   Social History: never smoker  Objective:  BP 132/84   Pulse 79   Temp 97.7 F (36.5 C) (Oral)   Ht 4\' 3"  (1.295 m)   Wt 160 lb 3.2 oz (72.7 kg)   SpO2 94%   BMI 43.30 kg/m   Gen:  83 y.o. female in NAD HEENT: NCAT, MMM, anicteric sclerae CV: RRR, no MRG Resp: Non-labored, CTAB, no wheezes noted Abd: Soft, NTND, BS present, no guarding or organomegaly Ext: WWP, 2+ pitting edema of RLE, no edema of LLE, no calf TTP, neg Homans MSK: Full ROM throughout LEs, strength intact, no joint tenderness Neuro: Alert and oriented, speech  normal       Chemistry      Component Value Date/Time   NA 138 02/28/2017 1653   K 4.5 02/28/2017 1653   CL 102 02/28/2017 1653   CO2 20 02/28/2017 1653   BUN 17 02/28/2017 1653   CREATININE 1.93 (H) 02/28/2017 1653   CREATININE 1.55 (H) 04/27/2016 0954      Component Value Date/Time   CALCIUM 9.2 02/28/2017 1653   ALKPHOS 87 02/28/2017 1653   AST 15 02/28/2017 1653   ALT 7 02/28/2017 1653   BILITOT 0.2 02/28/2017 1653      Lab Results  Component Value Date   WBC 6.8 02/28/2017   HGB 10.9 (L) 02/28/2017   HCT 33.2 (L) 02/28/2017   MCV 96 02/28/2017   PLT 267 02/28/2017   Lab Results  Component Value Date   TSH 10.090 (H) 02/28/2017   No results found for: HGBA1C Assessment & Plan:     Emily Campos is a 83 y.o. female here for   Edema of right lower extremity Most likely cause at this time is asymmetric venous insufficiency given prior episodes of right leg swelling ED workup negative for DVT Other possible causes include anemia, low albumin, renal dysfunction Doubt CHF given lack of weight gain or respiratory symptoms No preceding injuries or MSK abnormalities on exam We'll check CBC, CMP, TSH Hold  amlodipine as this could be contributing Advised elevation and compression if able Follow-up in one week to see if holding amlodipine has helped  HTN (hypertension) Currently well controlled Holding amlodipine as above for lower extremity edema Follow-up in one week    Bacigalupo, Dionne Bucy, MD MPH PGY-3,  [facility] 03/01/2017  8:30 AM

## 2017-03-01 ENCOUNTER — Telehealth: Payer: Self-pay | Admitting: Family Medicine

## 2017-03-01 LAB — CMP14+EGFR
ALBUMIN: 4.1 g/dL (ref 3.5–4.7)
ALK PHOS: 87 IU/L (ref 39–117)
ALT: 7 IU/L (ref 0–32)
AST: 15 IU/L (ref 0–40)
Albumin/Globulin Ratio: 1.3 (ref 1.2–2.2)
BUN / CREAT RATIO: 9 — AB (ref 12–28)
BUN: 17 mg/dL (ref 8–27)
Bilirubin Total: 0.2 mg/dL (ref 0.0–1.2)
CALCIUM: 9.2 mg/dL (ref 8.7–10.3)
CO2: 20 mmol/L (ref 20–29)
Chloride: 102 mmol/L (ref 96–106)
Creatinine, Ser: 1.93 mg/dL — ABNORMAL HIGH (ref 0.57–1.00)
GFR, EST AFRICAN AMERICAN: 26 mL/min/{1.73_m2} — AB (ref >59)
GFR, EST NON AFRICAN AMERICAN: 23 mL/min/{1.73_m2} — AB (ref >59)
GLUCOSE: 96 mg/dL (ref 65–99)
Globulin, Total: 3.2 g/dL (ref 1.5–4.5)
Potassium: 4.5 mmol/L (ref 3.5–5.2)
Sodium: 138 mmol/L (ref 134–144)
TOTAL PROTEIN: 7.3 g/dL (ref 6.0–8.5)

## 2017-03-01 LAB — CBC
HEMATOCRIT: 33.2 % — AB (ref 34.0–46.6)
Hemoglobin: 10.9 g/dL — ABNORMAL LOW (ref 11.1–15.9)
MCH: 31.6 pg (ref 26.6–33.0)
MCHC: 32.8 g/dL (ref 31.5–35.7)
MCV: 96 fL (ref 79–97)
Platelets: 267 10*3/uL (ref 150–379)
RBC: 3.45 x10E6/uL — ABNORMAL LOW (ref 3.77–5.28)
RDW: 13.9 % (ref 12.3–15.4)
WBC: 6.8 10*3/uL (ref 3.4–10.8)

## 2017-03-01 LAB — TSH: TSH: 10.09 u[IU]/mL — ABNORMAL HIGH (ref 0.450–4.500)

## 2017-03-01 NOTE — Assessment & Plan Note (Signed)
Most likely cause at this time is asymmetric venous insufficiency given prior episodes of right leg swelling ED workup negative for DVT Other possible causes include anemia, low albumin, renal dysfunction Doubt CHF given lack of weight gain or respiratory symptoms No preceding injuries or MSK abnormalities on exam We'll check CBC, CMP, TSH Hold amlodipine as this could be contributing Advised elevation and compression if able Follow-up in one week to see if holding amlodipine has helped

## 2017-03-01 NOTE — Telephone Encounter (Signed)
Called patient to discuss lab results.  K is ok on 36mEq daily. Can continue while taking daily lasix.  Suspect Cr is slightly elevated above baseline 2/2 more frequent lasix usage.  Will continue for now and recheck next week.  Will advise adequate hydration.  TSH is elevated to 10.  Unclear whether patient was taking Synthroid as prescribed (172mcg daily).  Hemoglobin is stable.  No answer when called patient. Left VM asking her to call back to clinic.  If she calls back, please ask her how she is taking Synthroid.  I will discuss in more detail with her at follow-up visit next week.  Virginia Crews, MD, MPH PGY-3,  [facility] 03/01/2017 9:40 AM

## 2017-03-01 NOTE — Assessment & Plan Note (Signed)
Currently well controlled Holding amlodipine as above for lower extremity edema Follow-up in one week

## 2017-03-11 ENCOUNTER — Encounter: Payer: Self-pay | Admitting: Family Medicine

## 2017-03-11 ENCOUNTER — Ambulatory Visit (INDEPENDENT_AMBULATORY_CARE_PROVIDER_SITE_OTHER): Payer: Medicare Other | Admitting: Family Medicine

## 2017-03-11 VITALS — BP 130/80 | HR 66 | Temp 97.7°F

## 2017-03-11 DIAGNOSIS — R600 Localized edema: Secondary | ICD-10-CM | POA: Diagnosis not present

## 2017-03-11 NOTE — Patient Instructions (Signed)
I will make a referral for you to the vascular surgeon to further assess you leg swelling. We will give you call with the appointment date/time.  Lymphedema Lymphedema is swelling that is caused by the abnormal collection of lymph under the skin. Lymph is fluid from the tissues in your body that travels in the lymphatic system. This system is part of the immune system and includes lymph nodes and lymph vessels. The lymph vessels collect and carry the excess fluid, fats, proteins, and wastes from the tissues of the body to the bloodstream. This system also works to clean and remove bacteria and waste products from the body. Lymphedema occurs when the lymphatic system is blocked. When the lymph vessels or lymph nodes are blocked or damaged, lymph does not drain properly, causing an abnormal buildup of lymph. This leads to swelling in the arms or legs. Lymphedema cannot be cured by medicines, but various methods can be used to help reduce the swelling. What are the causes? There are two types of lymphedema. Primary lymphedema is caused by the absence or abnormality of the lymph vessel at birth. Secondary lymphedema is more common. It occurs when the lymph vessel is damaged or blocked. Common causes of lymph vessel blockage include:  Skin infection, such as cellulitis.  Infection by parasites (filariasis).  Injury.  Cancer.  Radiation therapy.  Formation of scar tissue.  Surgery.  What are the signs or symptoms? Symptoms of this condition include:  Swelling of the arm or leg.  A heavy or tight feeling in the arm or leg.  Swelling of the feet, toes, or fingers. Shoes or rings may fit more tightly than before.  Redness of the skin over the affected area.  Limited movement of the affected limb.  Sensitivity to touch or discomfort in the affected limb.  How is this diagnosed? This condition may be diagnosed with:  A physical exam.  Medical history.  Bioimpedance spectroscopy. In  this test, painless electrical currents are used to measure fluid levels in your body.  Imaging tests, such as: ? Lymphoscintigraphy. In this test, a low dose of a radioactive substance is injected to trace the flow of lymph through the lymph vessels. ? MRI. ? CT scan. ? Duplex ultrasound. This test uses sound waves to produce images of the vessels and the blood flow on a screen. ? Lymphangiography. In this test, a contrast dye is injected into the lymph vessel to help show blockages.  How is this treated? Treatment for this condition may depend on the cause. Treatment may include:  Exercise. Certain exercises can help fluid move out of the affected limb.  Massage. Gentle massage of the affected limb can help move the fluid out of the area.  Compression. Various methods may be used to apply pressure to the affected limb in order to reduce the swelling. ? Wearing compression stockings or sleeves on the affected limb. ? Bandaging the affected limb. ? Using an external pump that is attached to a sleeve that alternates between applying pressure and releasing pressure.  Surgery. This is usually only done for severe cases. For example, surgery may be done if you have trouble moving the limb or if the swelling does not get better with other treatments.  If an underlying condition is causing the lymphedema, treatment for that condition is needed. For example, antibiotic medicines may be used to treat an infection. Follow these instructions at home: Activities  Exercise regularly as directed by your health care provider.  Do  not sit with your legs crossed.  When possible, keep the affected limb raised (elevated) above the level of your heart.  Avoid carrying things with an arm that is affected by lymphedema.  Remember that the affected area is more likely to become injured or infected.  Take these steps to help prevent infection: ? Keep the affected area clean and dry. ? Protect your  skin from cuts. For example, you should use gloves while cooking or gardening. Do not walk barefoot. If you shave the affected area, use an Copy. General instructions  Take medicines only as directed by your health care provider.  Eat a healthy diet that includes a lot of fruits and vegetables.  Do not wear tight clothes, shoes, or jewelry.  Do not use heating pads over the affected area.  Avoid having blood pressure checked on the affected limb.  Keep all follow-up visits as directed by your health care provider. This is important. Contact a health care provider if:  You continue to have swelling in your limb.  You have a fever.  You have a cut that does not heal.  You have redness or pain in the affected area.  You have new swelling in your limb that comes on suddenly.  You develop purplish spots or sores (lesions) on your limb. Get help right away if:  You have a skin rash.  You have chills or sweats.  You have shortness of breath. This information is not intended to replace advice given to you by your health care provider. Make sure you discuss any questions you have with your health care provider. Document Released: 06/27/2007 Document Revised: 05/06/2016 Document Reviewed: 08/07/2014 Elsevier Interactive Patient Education  [employer].

## 2017-03-12 NOTE — Progress Notes (Signed)
   Subjective:    Patient ID: Emily Campos, female    DOB: 1926-07-08, 83 y.o.   MRN: [id number]   CC: Follow up for unilateral leg swelling  HPI: [name] is a 83 y.o. female with a past medical history significant for HTN, diastolic heart failure, GERD, CKDIII, Chronic Pain Syndrome here for follow up for right leg swelling. Patient was seen in clinic on 6/19 for similar problem after extensive work up in the past to rule out DVT. Blood work was sent to rule out anemia and renal dysfunction as possible cause of leg swelling though most likely venous insufficiency. In addition to CMP, CBC and TSH amlodipine was held as a potential cause of edema. Today patient reports no changes in leg swelling, patient has had a history of leg swelling that improve in the past with lasix. patient is currently on lasix with no changes. She denies leg pain, shortness of breath, chest pain or palpitations.  Smoking status reviewed   ROS: all other systems were reviewed and are negative other than in the HPI   Past medical history, surgical, family, and social history reviewed and updated in the EMR as appropriate.  Objective:  BP 130/80   Pulse 66   Temp 97.7 F (36.5 C) (Oral)   SpO2 99%   Vitals and nursing note reviewed  General: NAD, pleasant, able to participate in exam Cardiac: RRR, normal heart sounds, no murmurs. 2+ radial and PT pulses bilaterally Respiratory: CTAB, normal effort, No wheezes, rales or rhonchi Abdomen: soft, nontender, nondistended, no hepatic or splenomegaly, +BS Extremities: Right lower leg and foot swelling, 2+ edema. Left LE is normal no edema. Skin: warm and dry, no rashes noted Neuro: alert and oriented x4, no focal deficits Psych: Normal affect and mood   Assessment & Plan:   #Right lower leg swelling, chronic Patient has had chronic right lower leg edema, DVTs work up has been negative most likely associated with venous insufficiency given age.  Patient blood work only positive for elevated TSH other CBC and CMP have been unremarkable. Discontinuation of amlodipine did not improve symptoms. Given chronicity and inconclusive work up will refer to vascular surgery for further evaluation --Vascular surgery referral --Return to ED if swelling signifcantly worsens or patient start to experience pain  #Hypothryroidism, uncontrolled Patient TSH was 10.09 on 02/28/2017 prior to that it was 0.40 on 04/27/2016. Patient has been asymptomatic but will need T4 level. Could be subclinical, with no need for treatment --Order T4 level at next PCP visit --Adjustment will likely be needed on her levothyroxine dosage    Marjie Skiff, MD Family Medicine Resident PGY-1

## 2017-03-22 ENCOUNTER — Other Ambulatory Visit: Payer: Self-pay

## 2017-03-22 DIAGNOSIS — R609 Edema, unspecified: Secondary | ICD-10-CM

## 2017-04-26 ENCOUNTER — Encounter: Payer: Self-pay | Admitting: Vascular Surgery

## 2017-04-28 ENCOUNTER — Other Ambulatory Visit: Payer: Self-pay | Admitting: Family Medicine

## 2017-04-28 DIAGNOSIS — E038 Other specified hypothyroidism: Secondary | ICD-10-CM

## 2017-04-28 DIAGNOSIS — I10 Essential (primary) hypertension: Secondary | ICD-10-CM

## 2017-04-29 ENCOUNTER — Ambulatory Visit (HOSPITAL_COMMUNITY)
Admission: RE | Admit: 2017-04-29 | Discharge: 2017-04-29 | Disposition: A | Discharge status: Home / Self Care | Payer: Medicare Other | Source: Ambulatory Visit | Attending: Vascular Surgery | Admitting: Vascular Surgery

## 2017-04-29 DIAGNOSIS — R609 Edema, unspecified: Secondary | ICD-10-CM

## 2017-05-04 ENCOUNTER — Ambulatory Visit (INDEPENDENT_AMBULATORY_CARE_PROVIDER_SITE_OTHER): Payer: Medicare Other | Admitting: Vascular Surgery

## 2017-05-04 ENCOUNTER — Encounter (HOSPITAL_COMMUNITY): Payer: Medicare Other

## 2017-05-04 ENCOUNTER — Encounter: Payer: Self-pay | Admitting: Vascular Surgery

## 2017-05-04 VITALS — BP 122/68 | HR 68 | Temp 97.3°F | Resp 18 | Ht <= 58 in

## 2017-05-04 DIAGNOSIS — I890 Lymphedema, not elsewhere classified: Secondary | ICD-10-CM | POA: Diagnosis not present

## 2017-05-04 DIAGNOSIS — I872 Venous insufficiency (chronic) (peripheral): Secondary | ICD-10-CM | POA: Insufficient documentation

## 2017-05-04 NOTE — Progress Notes (Signed)
Requested by:  Verner Mould, [address], [place] [id number]  Reason for consultation: bilateral leg swelling    History of Present Illness   Emily Campos is a 83 y.o. (02-10-1926) female who presents with chief complaint: bilateral leg swelling.  Patient notes, onset of swelling years ago.  Pt notes previously she has had increased swelling related to CHF, which improved with lasix.  The patient notes over the last few weeks the right leg has swollen to a greater extent than previously.  The foot swelling improves overnight but the shin swelling stays.  Now she notes the left foot is swelling also.  The patient has had no history of DVT, no history of pregnancy, no history of varicose vein, no history of venous stasis ulcers, no history of  Lymphedema and no history of skin changes in lower legs.  There is no family history of venous disorders.  The patient has used compression stockings in the past.  Past Medical History:  Diagnosis Date  . Chronic pain syndrome 1997  . HTN (hypertension)   . Renal disorder   . Sickle cell trait ([place])   . Thyroid disease     Past Surgical History:  Procedure Laterality Date  . APPENDECTOMY    . BACK SURGERY  1997    Social History   Social History  . Marital status: Single    Spouse name: N/A  . Number of children: N/A  . Years of education: N/A   Occupational History  . Not on file.   Social History Main Topics  . Smoking status: Never Smoker  . Smokeless tobacco: Never Used  . Alcohol use No  . Drug use: No  . Sexual activity: Not Currently     Comment: not sexually active since 1980   Other Topics Concern  . Not on file   Social History Narrative  . No narrative on file    Family History  Problem Relation Age of Onset  . Heart disease Mother   . High blood pressure Mother     Current Outpatient Prescriptions  Medication Sig Dispense Refill  . benzonatate (TESSALON) 100 MG capsule  Take 1 capsule (100 mg total) by mouth every 8 (eight) hours. 21 capsule 0  . cloNIDine (CATAPRES) 0.1 MG tablet Take 1 tablet (0.1 mg total) by mouth 3 (three) times daily. 270 tablet 3  . cyclobenzaprine (FLEXERIL) 5 MG tablet Take 1 tablet (5 mg total) by mouth daily as needed for muscle spasms. 30 tablet 2  . enalapril (VASOTEC) 20 MG tablet Take 1 tablet (20 mg total) by mouth daily. 90 tablet 3  . fluticasone (FLONASE) 50 MCG/ACT nasal spray Place 2 sprays into both nostrils daily. 16 g 6  . furosemide (LASIX) 20 MG tablet Take 2 tablets (40 mg total) by mouth daily. 180 tablet 3  . levothyroxine (SYNTHROID, LEVOTHROID) 100 MCG tablet take 1 tablet by mouth once daily BEFORE BREAKFAST 90 tablet 3  . potassium chloride (K-DUR) 10 MEQ tablet take 1 tablet by mouth once daily 90 tablet 3  . pravastatin (PRAVACHOL) 40 MG tablet Take 1 tablet (40 mg total) by mouth at bedtime. 90 tablet 3  . ranitidine (ZANTAC) 150 MG tablet Take 1 tablet (150 mg total) by mouth 2 (two) times daily. 60 tablet 2   No current facility-administered medications for this visit.     No Known Allergies  REVIEW OF SYSTEMS (negative unless checked):   Cardiac:  []   Chest pain or chest pressure? []  Shortness of breath upon activity? []  Shortness of breath when lying flat? []  Irregular heart rhythm?  Vascular:  []  Pain in calf, thigh, or hip brought on by walking? []  Pain in feet at night that wakes you up from your sleep? []  Blood clot in your veins? [x]  Leg swelling?  Pulmonary:  []  Oxygen at home? [x]  Productive cough? []  Wheezing?  Neurologic:  []  Sudden weakness in arms or legs? []  Sudden numbness in arms or legs? []  Sudden onset of difficult speaking or slurred speech? []  Temporary loss of vision in one eye? []  Problems with dizziness?  Gastrointestinal:  []  Blood in stool? []  Vomited blood?  Genitourinary:  []  Burning when urinating? []  Blood in urine?  Psychiatric:  []  Major  depression  Hematologic:  []  Bleeding problems? []  Problems with blood clotting?  Dermatologic:  []  Rashes or ulcers?  Constitutional:  []  Fever or chills?  Ear/Nose/Throat:  []  Change in hearing? []  Nose bleeds? []  Sore throat?  Musculoskeletal:  []  Back pain? []  Joint pain? []  Muscle pain?   Physical Examination     Vitals:   05/04/17 1457  BP: 122/68  Pulse: 68  Resp: 18  Temp: (!) 97.3 F (36.3 C)  SpO2: 95%  Height: 4\' 3"  (1.295 m)   There is no height or weight on file to calculate BMI.  General Alert, O x 3, ill appearing, wheel chair bound  Head [place]/AT,    Ear/Nose/ Throat Hearing grossly intact, nares without erythema or drainage, oropharynx without Erythema or Exudate, no teeth, Mallampati score: 3,   Eyes PERRLA, EOMI,    Neck Supple, mid-line trachea,  no JVD  Pulmonary Sym exp, good B air movt, CTA B  Cardiac RRR, Nl S1, S2, no Murmurs, No rubs, No S3,S4  Vascular Vessel Right Left  Radial Faintly palpable Faintly palpable  Brachial Palpable Palpable  Carotid Palpable, No Bruit Palpable, No Bruit  Aorta Not palpable N/A  Femoral Palpable Palpable  Popliteal Not palpable Not palpable  PT Not palpable Not palpable  DP Not palpable Not palpable    Gastro- intestinal soft, non-distended, non-tender to palpation, No guarding or rebound, no HSM, no masses, no CVAT B, No palpable prominent aortic pulse,    Musculo- skeletal M/S BUE 5/5 , Gaint not tested, BLE motor not tested, Extremities without ischemic changes  , R: 2-3+, L 1-2+,  No visible varicosities , No Lipodermatosclerosis present, no Karposi Stemmer sign in either foot  Neurologic Cranial nerves 2-12 intact , Pain and light touch intact in extremities , Motor exam as listed above  Psychiatric Judgement intact, Mood & affect appropriate for pt's clinical situation  Dermatologic See M/S exam for extremity exam, No rashes otherwise noted  Lymphatic  Palpable lymph nodes: None     Non-invasive Vascular Imaging   RLE Venous Reflux Duplex (05/04/2017)  No DVT  No GSV or SSV Reflux  Deep venous reflux: R CFV and FV   Outside Studies/Documentation   6 pages of outside documents were reviewed including: outpatient resident clinic charts.   Medical Decision Making   Emily Campos is a 83 y.o. 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  I suspect she might benefit from referral to Lymphedema clinic for lymphatic massage, compression, and SCD  I discussed with the patient the use of her 20-30 mm thigh high compression stockings.  There is no easy fix for this patient's phlebolymphedema  Thank you for allowing [place] to participate in this patient's care.   Adele Barthel, MD, FACS [facility] Office: [phone] Pager: [phone]  05/04/2017, 3:27 PM

## 2017-06-09 ENCOUNTER — Ambulatory Visit (INDEPENDENT_AMBULATORY_CARE_PROVIDER_SITE_OTHER): Payer: Medicare Other | Admitting: Internal Medicine

## 2017-06-09 ENCOUNTER — Encounter: Payer: Self-pay | Admitting: Internal Medicine

## 2017-06-09 VITALS — BP 172/102 | HR 87 | Temp 98.2°F | Wt 160.0 lb

## 2017-06-09 DIAGNOSIS — R05 Cough: Secondary | ICD-10-CM | POA: Diagnosis not present

## 2017-06-09 DIAGNOSIS — I5032 Chronic diastolic (congestive) heart failure: Secondary | ICD-10-CM | POA: Diagnosis not present

## 2017-06-09 DIAGNOSIS — E785 Hyperlipidemia, unspecified: Secondary | ICD-10-CM | POA: Diagnosis not present

## 2017-06-09 DIAGNOSIS — M79672 Pain in left foot: Secondary | ICD-10-CM

## 2017-06-09 DIAGNOSIS — E039 Hypothyroidism, unspecified: Secondary | ICD-10-CM

## 2017-06-09 DIAGNOSIS — M79671 Pain in right foot: Secondary | ICD-10-CM

## 2017-06-09 DIAGNOSIS — R059 Cough, unspecified: Secondary | ICD-10-CM

## 2017-06-09 DIAGNOSIS — M79604 Pain in right leg: Secondary | ICD-10-CM

## 2017-06-09 DIAGNOSIS — M79605 Pain in left leg: Secondary | ICD-10-CM

## 2017-06-09 DIAGNOSIS — I10 Essential (primary) hypertension: Secondary | ICD-10-CM

## 2017-06-09 MED ORDER — BENZONATATE 100 MG PO CAPS: 100.0000 mg | ORAL_CAPSULE | Freq: Three times a day (TID) | ORAL | 0 refills | Status: AC

## 2017-06-09 MED ORDER — GABAPENTIN 100 MG PO CAPS: 100.0000 mg | ORAL_CAPSULE | Freq: Three times a day (TID) | ORAL | 3 refills | Status: AC

## 2017-06-09 MED ORDER — PRAVASTATIN SODIUM 40 MG PO TABS: 40.0000 mg | ORAL_TABLET | Freq: Every day | ORAL | 3 refills | Status: DC

## 2017-06-09 MED ORDER — ENALAPRIL MALEATE 20 MG PO TABS: 20.0000 mg | ORAL_TABLET | Freq: Every day | ORAL | 3 refills | Status: DC

## 2017-06-09 MED ORDER — BENZONATATE 100 MG PO CAPS: 100.0000 mg | ORAL_CAPSULE | Freq: Three times a day (TID) | ORAL | 0 refills | Status: DC

## 2017-06-09 MED ORDER — CLONIDINE HCL 0.1 MG PO TABS: 0.1000 mg | ORAL_TABLET | Freq: Three times a day (TID) | ORAL | 3 refills | Status: DC

## 2017-06-09 MED ORDER — CYCLOBENZAPRINE HCL 5 MG PO TABS: 5.0000 mg | ORAL_TABLET | Freq: Every day | ORAL | 2 refills | Status: AC | PRN

## 2017-06-09 MED ORDER — FUROSEMIDE 20 MG PO TABS: 40.0000 mg | ORAL_TABLET | Freq: Every day | ORAL | 3 refills | Status: DC

## 2017-06-09 NOTE — Progress Notes (Signed)
   Subjective:   Patient: Emily Campos       Birthdate: 12-Nov-1925       MRN: [id number]      HPI  Alivia Balducci is a 83 y.o. female presenting for cough and foot/leg pain.   Cough Ongoing for months. Was prescribed Tessalon perles in past. Patient says these were somewhat helpful. Cough is non-productive. Now has nasal congestion as well. Has not taken anything for current symptoms. Denies fevers or difficulty breathing. Thinks that cough is due to air conditioning. Doesn't want to change air conditioning settings because she says she will get hot and die. Wants codeine cough syrup because she says this puts her to sleep and makes her feel better.   Foot/leg pain Chronic issue. Describes pain as sharp and shooting. Bilateral. Takes Flexeril 0.5-1 tab 1-2 times per day. Says this puts her to sleep so she does not feel pain. Has never tried anything for this pain other than Flexeril. Has been taking since at least 04/2016.   Smoking status reviewed. Patient is never smoker.   Review of Systems See HPI.     Objective:  Physical Exam  Constitutional: She is oriented to person, place, and time.  Elderly female sitting in wheelchair in NAD  HENT:  Head: Normocephalic and atraumatic.  Nose: Nose normal.  Mouth/Throat: Oropharynx is clear and moist. No oropharyngeal exudate.  Eyes: Pupils are equal, round, and reactive to light. Conjunctivae and EOM are normal. Right eye exhibits no discharge. Left eye exhibits no discharge.  Cardiovascular: Normal rate, regular rhythm and normal heart sounds.   No murmur heard. Pulmonary/Chest:  Lungs CTAB. No wheezes or crackles. Good air movement bilaterally. Normal WOB on RA. Speaking in full sentences. Minimal coughing throughout encounter.   Neurological: She is alert and oriented to person, place, and time.  Skin: Skin is warm and dry.  Psychiatric: Affect and judgment normal.       Assessment & Plan:  Bilateral leg and foot  pain Sounds more consistent with neuropathic pain rather than MSK, giving shooting nature and chronicity of pain. Has only ever taken Flexeril which she says is helpful only because it puts her to sleep. Given concern for neuropathic pain, will begin trial of gabapentin. Patient apprehensive to stopping Flexeril, so prescribed Flexeril #15 in additional to gabapentin for breakthrough pain while patient adjusting. F/u in one month. Will increase dose of gabapentin at that time if necessary.   Cough Not a new issue. Non-productive. Afebrile. Resolved briefly with Tessalon perles prescribed at prior visit. With accompanying nasal congestion, suggesting viral etiology. Lungs CTAB, normal WOB on RA, patient speaking in full sentences, and O2 sat on RA WNL. Given many reassuring factors, less concern for PNA or more serious etiology. Discussed with patient that codeine cough syrup specifically for the purpose of helping patient fall asleep is not the best medical care for her, both because of her age and because of the desired side effect of sleep. As Tessalon perles have worked in the past, will prescribe this again. Patient eventually amenable to this after long discussion. Will continue to monitor and consider imaging if no improvement.    Adin Hector, MD, MPH PGY-3 [facility] Pager [phone]

## 2017-06-09 NOTE — Assessment & Plan Note (Addendum)
Sounds more consistent with neuropathic pain rather than MSK, giving shooting nature and chronicity of pain. Has only ever taken Flexeril which she says is helpful only because it puts her to sleep. Given concern for neuropathic pain, will begin trial of gabapentin. Patient apprehensive to stopping Flexeril, so prescribed Flexeril #15 in additional to gabapentin for breakthrough pain while patient adjusting. F/u in one month. Will increase dose of gabapentin at that time if necessary.

## 2017-06-09 NOTE — Patient Instructions (Addendum)
It was nice meeting you today Emily Campos!  Please begin taking gabapentin today. You will start by taking one tablet (100 mg) daily for the next 3 days. If you are still having pain, you can then add another tablet at nighttime. If you are still having pain after 3 days of taking two tablets, you can add a third tablet around lunch time (three pills total throughout the day). You can take Flexeril only as needed if you are still having pain despite taking gabapentin.   Please continue taking your other medications as you have been.   I will see you back in about a month to see if your pain is improving.   If you have any questions or concerns, please feel free to call the clinic.   Be well,  Dr. Avon Gully

## 2017-06-10 DIAGNOSIS — R05 Cough: Secondary | ICD-10-CM | POA: Insufficient documentation

## 2017-06-10 DIAGNOSIS — R059 Cough, unspecified: Secondary | ICD-10-CM | POA: Insufficient documentation

## 2017-06-10 LAB — T4, FREE: FREE T4: 1.08 ng/dL (ref 0.82–1.77)

## 2017-06-10 NOTE — Assessment & Plan Note (Signed)
Not a new issue. Non-productive. Afebrile. Resolved briefly with Tessalon perles prescribed at prior visit. With accompanying nasal congestion, suggesting viral etiology. Lungs CTAB, normal WOB on RA, patient speaking in full sentences, and O2 sat on RA WNL. Given many reassuring factors, less concern for PNA or more serious etiology. Discussed with patient that codeine cough syrup specifically for the purpose of helping patient fall asleep is not the best medical care for her, both because of her age and because of the desired side effect of sleep. As Tessalon perles have worked in the past, will prescribe this again. Patient eventually amenable to this after long discussion. Will continue to monitor and consider imaging if no improvement.

## 2017-06-17 ENCOUNTER — Encounter (HOSPITAL_COMMUNITY): Payer: Medicare Other

## 2017-06-17 ENCOUNTER — Ambulatory Visit: Payer: Medicare Other | Admitting: Vascular Surgery

## 2017-06-26 ENCOUNTER — Other Ambulatory Visit: Payer: Self-pay | Admitting: Family Medicine

## 2017-06-30 ENCOUNTER — Other Ambulatory Visit: Payer: Self-pay | Admitting: Family Medicine

## 2017-07-01 NOTE — Telephone Encounter (Signed)
Will send to PCP  Bacigalupo, Dionne Bucy, MD, MPH [facility] 07/01/2017 8:30 AM

## 2017-08-07 ENCOUNTER — Emergency Department (HOSPITAL_COMMUNITY): Payer: Medicare Other

## 2017-08-07 ENCOUNTER — Encounter (HOSPITAL_COMMUNITY): Payer: Self-pay

## 2017-08-07 ENCOUNTER — Observation Stay (HOSPITAL_COMMUNITY)
Admission: EM | Admit: 2017-08-07 | Discharge: 2017-08-09 | Disposition: A | Discharge status: Skilled Nursing Facility | Payer: Medicare Other | Attending: Family Medicine | Admitting: Family Medicine

## 2017-08-07 DIAGNOSIS — E872 Acidosis, unspecified: Secondary | ICD-10-CM

## 2017-08-07 DIAGNOSIS — K219 Gastro-esophageal reflux disease without esophagitis: Secondary | ICD-10-CM | POA: Insufficient documentation

## 2017-08-07 DIAGNOSIS — I2510 Atherosclerotic heart disease of native coronary artery without angina pectoris: Secondary | ICD-10-CM | POA: Diagnosis not present

## 2017-08-07 DIAGNOSIS — R600 Localized edema: Secondary | ICD-10-CM | POA: Insufficient documentation

## 2017-08-07 DIAGNOSIS — R748 Abnormal levels of other serum enzymes: Secondary | ICD-10-CM | POA: Diagnosis not present

## 2017-08-07 DIAGNOSIS — N179 Acute kidney failure, unspecified: Secondary | ICD-10-CM | POA: Insufficient documentation

## 2017-08-07 DIAGNOSIS — I483 Typical atrial flutter: Secondary | ICD-10-CM

## 2017-08-07 DIAGNOSIS — I5032 Chronic diastolic (congestive) heart failure: Secondary | ICD-10-CM | POA: Insufficient documentation

## 2017-08-07 DIAGNOSIS — D649 Anemia, unspecified: Secondary | ICD-10-CM | POA: Insufficient documentation

## 2017-08-07 DIAGNOSIS — M79604 Pain in right leg: Secondary | ICD-10-CM | POA: Diagnosis not present

## 2017-08-07 DIAGNOSIS — E039 Hypothyroidism, unspecified: Secondary | ICD-10-CM | POA: Insufficient documentation

## 2017-08-07 DIAGNOSIS — E785 Hyperlipidemia, unspecified: Secondary | ICD-10-CM | POA: Diagnosis not present

## 2017-08-07 DIAGNOSIS — E860 Dehydration: Secondary | ICD-10-CM | POA: Insufficient documentation

## 2017-08-07 DIAGNOSIS — R7989 Other specified abnormal findings of blood chemistry: Secondary | ICD-10-CM

## 2017-08-07 DIAGNOSIS — I4892 Unspecified atrial flutter: Secondary | ICD-10-CM | POA: Insufficient documentation

## 2017-08-07 DIAGNOSIS — D573 Sickle-cell trait: Secondary | ICD-10-CM | POA: Diagnosis not present

## 2017-08-07 DIAGNOSIS — Z79899 Other long term (current) drug therapy: Secondary | ICD-10-CM | POA: Diagnosis not present

## 2017-08-07 DIAGNOSIS — R531 Weakness: Secondary | ICD-10-CM | POA: Diagnosis not present

## 2017-08-07 DIAGNOSIS — R1310 Dysphagia, unspecified: Secondary | ICD-10-CM | POA: Insufficient documentation

## 2017-08-07 DIAGNOSIS — R5383 Other fatigue: Principal | ICD-10-CM | POA: Insufficient documentation

## 2017-08-07 DIAGNOSIS — N183 Chronic kidney disease, stage 3 (moderate): Secondary | ICD-10-CM | POA: Insufficient documentation

## 2017-08-07 DIAGNOSIS — R740 Nonspecific elevation of levels of transaminase and lactic acid dehydrogenase [LDH]: Secondary | ICD-10-CM | POA: Diagnosis not present

## 2017-08-07 DIAGNOSIS — I249 Acute ischemic heart disease, unspecified: Secondary | ICD-10-CM | POA: Diagnosis present

## 2017-08-07 DIAGNOSIS — I130 Hypertensive heart and chronic kidney disease with heart failure and stage 1 through stage 4 chronic kidney disease, or unspecified chronic kidney disease: Secondary | ICD-10-CM | POA: Insufficient documentation

## 2017-08-07 DIAGNOSIS — R778 Other specified abnormalities of plasma proteins: Secondary | ICD-10-CM

## 2017-08-07 LAB — CBC WITH DIFFERENTIAL/PLATELET
BASOS PCT: 0 %
Basophils Absolute: 0 10*3/uL (ref 0.0–0.1)
EOS PCT: 0 %
Eosinophils Absolute: 0 10*3/uL (ref 0.0–0.7)
HEMATOCRIT: 34.5 % — AB (ref 36.0–46.0)
Hemoglobin: 11.4 g/dL — ABNORMAL LOW (ref 12.0–15.0)
LYMPHS ABS: 1.3 10*3/uL (ref 0.7–4.0)
Lymphocytes Relative: 9 %
MCH: 31.8 pg (ref 26.0–34.0)
MCHC: 33 g/dL (ref 30.0–36.0)
MCV: 96.1 fL (ref 78.0–100.0)
MONOS PCT: 7 %
Monocytes Absolute: 1 10*3/uL (ref 0.1–1.0)
Neutro Abs: 12.1 10*3/uL — ABNORMAL HIGH (ref 1.7–7.7)
Neutrophils Relative %: 84 %
Platelets: 256 10*3/uL (ref 150–400)
RBC: 3.59 MIL/uL — AB (ref 3.87–5.11)
RDW: 12.7 % (ref 11.5–15.5)
WBC: 14.4 10*3/uL — AB (ref 4.0–10.5)

## 2017-08-07 LAB — CK: CK TOTAL: 247 U/L — AB (ref 38–234)

## 2017-08-07 LAB — COMPREHENSIVE METABOLIC PANEL
ALBUMIN: 2.8 g/dL — AB (ref 3.5–5.0)
ALT: 262 U/L — ABNORMAL HIGH (ref 14–54)
AST: 360 U/L — AB (ref 15–41)
Alkaline Phosphatase: 91 U/L (ref 38–126)
Anion gap: 11 (ref 5–15)
BUN: 34 mg/dL — AB (ref 6–20)
CHLORIDE: 104 mmol/L (ref 101–111)
CO2: 17 mmol/L — AB (ref 22–32)
Calcium: 8.5 mg/dL — ABNORMAL LOW (ref 8.9–10.3)
Creatinine, Ser: 2.27 mg/dL — ABNORMAL HIGH (ref 0.44–1.00)
GFR calc Af Amer: 21 mL/min — ABNORMAL LOW (ref >60)
GFR, EST NON AFRICAN AMERICAN: 18 mL/min — AB (ref >60)
Glucose, Bld: 146 mg/dL — ABNORMAL HIGH (ref 65–99)
POTASSIUM: 4 mmol/L (ref 3.5–5.1)
SODIUM: 132 mmol/L — AB (ref 135–145)
Total Bilirubin: 0.6 mg/dL (ref 0.3–1.2)
Total Protein: 7.5 g/dL (ref 6.5–8.1)

## 2017-08-07 LAB — URINALYSIS, ROUTINE W REFLEX MICROSCOPIC
Bilirubin Urine: NEGATIVE
Glucose, UA: NEGATIVE mg/dL
Ketones, ur: NEGATIVE mg/dL
LEUKOCYTES UA: NEGATIVE
Nitrite: NEGATIVE
PROTEIN: NEGATIVE mg/dL
SPECIFIC GRAVITY, URINE: 1.012 (ref 1.005–1.030)
pH: 5 (ref 5.0–8.0)

## 2017-08-07 LAB — I-STAT CHEM 8, ED
BUN: 38 mg/dL — AB (ref 6–20)
CALCIUM ION: 1.09 mmol/L — AB (ref 1.15–1.40)
CHLORIDE: 113 mmol/L — AB (ref 101–111)
Creatinine, Ser: 2.2 mg/dL — ABNORMAL HIGH (ref 0.44–1.00)
Glucose, Bld: 141 mg/dL — ABNORMAL HIGH (ref 65–99)
HEMATOCRIT: 40 % (ref 36.0–46.0)
Hemoglobin: 13.6 g/dL (ref 12.0–15.0)
Potassium: 4.9 mmol/L (ref 3.5–5.1)
SODIUM: 136 mmol/L (ref 135–145)
TCO2: 13 mmol/L — ABNORMAL LOW (ref 22–32)

## 2017-08-07 LAB — I-STAT TROPONIN, ED
TROPONIN I, POC: 0.59 ng/mL — AB (ref 0.00–0.08)
Troponin i, poc: 1.12 ng/mL (ref 0.00–0.08)

## 2017-08-07 LAB — I-STAT CG4 LACTIC ACID, ED
LACTIC ACID, VENOUS: 5.19 mmol/L — AB (ref 0.5–1.9)
Lactic Acid, Venous: 1.43 mmol/L (ref 0.5–1.9)

## 2017-08-07 MED ORDER — SODIUM CHLORIDE 0.9 % IV BOLUS (SEPSIS)
500.0000 mL | Freq: Once | INTRAVENOUS | Status: AC
  Administered 2017-08-07: 500 mL via INTRAVENOUS

## 2017-08-07 MED ORDER — DILTIAZEM LOAD VIA INFUSION
20.0000 mg | Freq: Once | INTRAVENOUS | Status: AC
  Administered 2017-08-07: 20 mg via INTRAVENOUS
  Filled 2017-08-07: qty 20

## 2017-08-07 MED ORDER — HEPARIN (PORCINE) IN NACL 100-0.45 UNIT/ML-% IJ SOLN
700.0000 [IU]/h | INTRAMUSCULAR | Status: DC
  Administered 2017-08-07: 700 [IU]/h via INTRAVENOUS
  Filled 2017-08-07: qty 250

## 2017-08-07 MED ORDER — ACETAMINOPHEN 500 MG PO TABS
1000.0000 mg | ORAL_TABLET | Freq: Once | ORAL | Status: AC
  Administered 2017-08-07: 1000 mg via ORAL
  Filled 2017-08-07: qty 2

## 2017-08-07 MED ORDER — SODIUM CHLORIDE 0.9 % IV BOLUS (SEPSIS)
1000.0000 mL | Freq: Once | INTRAVENOUS | Status: AC
  Administered 2017-08-07: 1000 mL via INTRAVENOUS

## 2017-08-07 MED ORDER — DILTIAZEM HCL 100 MG IV SOLR
5.0000 mg/h | INTRAVENOUS | Status: DC
  Administered 2017-08-07: 5 mg/h via INTRAVENOUS
  Filled 2017-08-07: qty 100

## 2017-08-07 MED ORDER — HEPARIN BOLUS VIA INFUSION
1000.0000 [IU] | Freq: Once | INTRAVENOUS | Status: AC
  Administered 2017-08-07: 1000 [IU] via INTRAVENOUS
  Filled 2017-08-07: qty 1000

## 2017-08-07 NOTE — ED Notes (Signed)
MD at bedside, aware pts BP, orders to continue with Cardizem @ 5mg /hr

## 2017-08-07 NOTE — ED Notes (Signed)
Granddaughter called from [place]; would like to give health information regarding this patient.  Her name is Lasaundra Riche [phone]).

## 2017-08-07 NOTE — ED Provider Notes (Addendum)
[facility] EMERGENCY DEPARTMENT Provider Note   CSN: [id number] Arrival date & time: 08/07/17  1841     History   Chief Complaint Chief Complaint  Patient presents with  . Weakness    HPI Emily Campos is a 83 y.o. female.  83 yo F with a chief complaint of weakness.  The patient states that she has been unable to get out of a chair due to right leg pain.  She describes that this is her normal gout.  She has been there day and night.  She has had multiple urinary events on herself.  She decided to stop taking her Lasix couple days ago because of the recurrent need to urinate.  She has started to be unable to get out of a chair.  He has now been unable to get out of a chair.  911 was called by her facility earlier today but she refused to come.  They called again this afternoon and convinced her that she needed to come.  She denies cough congestion fevers has had increased urinary episodes.  Has had some shortness of breath and generalized fatigue.  Denies chest pain.   The history is provided by the patient.  Illness  This is a new problem. The current episode started more than 1 week ago. The problem occurs constantly. The problem has been gradually worsening. Associated symptoms include shortness of breath. Pertinent negatives include no chest pain and no headaches. Nothing aggravates the symptoms. Nothing relieves the symptoms. She has tried nothing for the symptoms. The treatment provided no relief.    Past Medical History:  Diagnosis Date  . Chronic pain syndrome 1997  . HTN (hypertension)   . Renal disorder   . Sickle cell trait ([place])   . Thyroid disease     Patient Active Problem List   Diagnosis Date Noted  . Cough 06/10/2017  . Bilateral leg and foot pain 06/09/2017  . Chronic venous insufficiency 05/04/2017  . Sore throat 10/05/2016  . GERD (gastroesophageal reflux disease) 10/05/2016  . Edema of right lower extremity 04/27/2016  . Chronic  rhinosinusitis 09/17/2015  . Chronic pain syndrome 09/17/2015  . Hypertensive heart disease 05/22/2015  . Hypertensive kidney and heart disease without congestive heart failure, stage III ([place]) 05/22/2015  . Hyperlipidemia 03/19/2015  . CKD (chronic kidney disease) stage 3, GFR 30-59 ml/min (HCC) 09/26/2014  . HTN (hypertension) 09/25/2014  . Hypothyroidism 09/25/2014  . Chronic back pain 09/25/2014  . Osteoarthritis of both knees 09/25/2014  . Chronic diastolic CHF (congestive heart failure), NYHA class 2 ([place]) 09/25/2014    Past Surgical History:  Procedure Laterality Date  . APPENDECTOMY    . BACK SURGERY  1997    OB History    No data available       Home Medications    Prior to Admission medications   Medication Sig Start Date End Date Taking? Authorizing Provider  benzonatate (TESSALON) 100 MG capsule Take 1 capsule (100 mg total) by mouth every 8 (eight) hours. 06/09/17   Verner Mould, MD  cloNIDine (CATAPRES) 0.1 MG tablet Take 1 tablet (0.1 mg total) by mouth 3 (three) times daily. 06/09/17   Verner Mould, MD  cyclobenzaprine (FLEXERIL) 5 MG tablet Take 1 tablet (5 mg total) by mouth daily as needed for muscle spasms. 06/09/17   Verner Mould, MD  enalapril (VASOTEC) 20 MG tablet Take 1 tablet (20 mg total) by mouth daily. 06/09/17   Verner Mould, MD  furosemide (LASIX) 20 MG tablet Take 2 tablets (40 mg total) by mouth daily. 06/09/17   Verner Mould, MD  gabapentin (NEURONTIN) 100 MG capsule Take 1 capsule (100 mg total) by mouth 3 (three) times daily. 06/09/17   Verner Mould, MD  levothyroxine (SYNTHROID, LEVOTHROID) 100 MCG tablet take 1 tablet by mouth once daily BEFORE BREAKFAST 04/29/17   Verner Mould, MD  potassium chloride (K-DUR) 10 MEQ tablet take 1 tablet by mouth once daily 04/29/17   Verner Mould, MD  pravastatin (PRAVACHOL) 40 MG tablet Take 1 tablet (40 mg total)  by mouth at bedtime. 06/09/17   Verner Mould, MD  ranitidine [facility]) 150 MG tablet take 1 tablet by mouth twice a day 07/01/17   Verner Mould, MD    Family History Family History  Problem Relation Age of Onset  . Heart disease Mother   . High blood pressure Mother     Social History Social History   Tobacco Use  . Smoking status: Never Smoker  . Smokeless tobacco: Never Used  Substance Use Topics  . Alcohol use: No    Alcohol/week: 0.0 oz  . Drug use: No     Allergies   Patient has no known allergies.   Review of Systems Review of Systems  Constitutional: Negative for chills and fever.  HENT: Negative for congestion and rhinorrhea.   Eyes: Negative for redness and visual disturbance.  Respiratory: Positive for shortness of breath. Negative for wheezing.   Cardiovascular: Positive for leg swelling. Negative for chest pain and palpitations.  Gastrointestinal: Negative for nausea and vomiting.  Genitourinary: Negative for dysuria and urgency.  Musculoskeletal: Negative for arthralgias and myalgias.  Skin: Negative for pallor and wound.  Neurological: Positive for weakness. Negative for dizziness and headaches.     Physical Exam Updated Vital Signs BP 103/83   Pulse 96   Temp (!) 97.5 F (36.4 C) (Oral)   Resp (!) 21   Ht _0  (1.295 m)   Wt 72.6 kg (160 lb)   SpO2 94%   BMI 43.25 kg/m   Physical Exam  Constitutional: She is oriented to person, place, and time. No distress.  Chronically ill-appearing  HENT:  Head: Normocephalic and atraumatic.  Eyes: EOM are normal. Pupils are equal, round, and reactive to light.  Neck: Normal range of motion. Neck supple.  Cardiovascular: Regular rhythm. Tachycardia present. Exam reveals no gallop and no friction rub.  No murmur heard. Pulmonary/Chest: Effort normal. She has no wheezes. She has no rales.  Abdominal: Soft. She exhibits no distension and no mass. There is no tenderness. There is  no guarding.  Musculoskeletal: She exhibits edema (RLE >L). She exhibits no tenderness.  Neurological: She is alert and oriented to person, place, and time.  Skin: Skin is warm and dry. She is not diaphoretic.  Psychiatric: She has a normal mood and affect. Her behavior is normal.  Nursing note and vitals reviewed.    ED Treatments / Results  Labs (all labs ordered are listed, but only abnormal results are displayed) Labs Reviewed  COMPREHENSIVE METABOLIC PANEL - Abnormal; Notable for the following components:      Result Value   Sodium 132 (*)    CO2 17 (*)    Glucose, Bld 146 (*)    BUN 34 (*)    Creatinine, Ser 2.27 (*)    Calcium 8.5 (*)    Albumin 2.8 (*)    AST 360 (*)  ALT 262 (*)    GFR calc non Af Amer 18 (*)    GFR calc Af Amer 21 (*)    All other components within normal limits  CBC WITH DIFFERENTIAL/PLATELET - Abnormal; Notable for the following components:   WBC 14.4 (*)    RBC 3.59 (*)    Hemoglobin 11.4 (*)    HCT 34.5 (*)    Neutro Abs 12.1 (*)    All other components within normal limits  I-STAT CG4 LACTIC ACID, ED - Abnormal; Notable for the following components:   Lactic Acid, Venous 5.19 (*)    All other components within normal limits  I-STAT CHEM 8, ED - Abnormal; Notable for the following components:   Chloride 113 (*)    BUN 38 (*)    Creatinine, Ser 2.20 (*)    Glucose, Bld 141 (*)    Calcium, Ion 1.09 (*)    TCO2 13 (*)    All other components within normal limits  I-STAT TROPONIN, ED - Abnormal; Notable for the following components:   Troponin i, poc 0.59 (*)    All other components within normal limits  CULTURE, BLOOD (ROUTINE X 2)  CULTURE, BLOOD (ROUTINE X 2)  URINE CULTURE  URINALYSIS, ROUTINE W REFLEX MICROSCOPIC  BRAIN NATRIURETIC PEPTIDE  CK  HEPARIN LEVEL (UNFRACTIONATED)  I-STAT CG4 LACTIC ACID, ED    EKG  EKG Interpretation  Date/Time:  Sunday August 07 2017 18:48:09 EST Ventricular Rate:  153 PR Interval:    QRS  Duration: 80 QT Interval:  285 QTC Calculation: 455 R Axis:   56 Text Interpretation:  afib vs aflutter ST depression, probably rate related Otherwise no significant change Confirmed by Deno Etienne [phone]) on 08/07/2017 6:55:03 PM       Radiology Dg Chest Portable 1 View  Result Date: 08/07/2017 CLINICAL DATA:  Patient with weakness. EXAM: PORTABLE CHEST 1 VIEW COMPARISON:  Chest radiograph 04/15/2016. FINDINGS: Monitoring leads overlie the patient. Stable cardiac and mediastinal contours. Tortuosity of the thoracic aorta. No consolidative pulmonary opacities. No pleural effusion or pneumothorax. IMPRESSION: No acute cardiopulmonary process. Electronically Signed   By: Lovey Newcomer M.D.   On: 08/07/2017 21:32    Procedures Emergency Focused Ultrasound Exam Limited ultrasound of the Bladder.   Performed and interpreted by Dr. Tyrone Nine Indication: evaluation for urinary retention Transverse and Sagittal views of the bladder are obtained and calculations are performed to determine an estimated bladder volume for signs of post-renal obstruction.  Findings: Bladder is 147cc  full Interpretation: no evidence of outlet obstruction Images archived electronically.  CPT Codes: [id number] and 78938  BLADDER CATHETERIZATION Date/Time: 08/07/2017 10:29 PM Performed by: Deno Etienne, DO Authorized by: Deno Etienne, DO   Consent:    Consent obtained:  Verbal   Consent given by:  Patient   Risks discussed:  False passage, infection, pain and incomplete procedure Pre-procedure details:    Procedure purpose:  Diagnostic   Preparation: Patient was prepped and draped in usual sterile fashion   Anesthesia (see MAR for exact dosages):    Anesthesia method:  None Procedure details:    Provider performed due to:  Complicated insertion and nurse unable to complete   Catheter insertion:  Non-indwelling   Catheter type: I&O kit.   Catheter size:  8 Fr   Bladder irrigation: no     Number of attempts:  1    Urine characteristics:  Clear Post-procedure details:    Patient tolerance of procedure:  Tolerated well, no immediate complications   (  including critical care time)  Medications Ordered in ED Medications  diltiazem (CARDIZEM) 1 mg/mL load via infusion 20 mg (20 mg Intravenous Bolus from Bag 08/07/17 1941)    And  diltiazem (CARDIZEM) 100 mg in dextrose 5 % 100 mL (1 mg/mL) infusion (0 mg/hr Intravenous Stopped 08/07/17 2120)  heparin bolus via infusion 1,000 Units (not administered)  heparin ADULT infusion 100 units/mL (25000 units/246m sodium chloride 0.45%) (not administered)  acetaminophen (TYLENOL) tablet 1,000 mg (not administered)  sodium chloride 0.9 % bolus 500 mL (0 mLs Intravenous Stopped 08/07/17 2137)  sodium chloride 0.9 % bolus 1,000 mL (1,000 mLs Intravenous New Bag/Given 08/07/17 2120)     Initial Impression / Assessment and Plan / ED Course  I have reviewed the triage vital signs and the nursing notes.  Pertinent labs & imaging results that were available during my care of the patient were reviewed by me and considered in my medical decision making (see chart for details).     83yo F with a chief complaint of fatigue.  The patient thinks this is related to her increased fluid overload.  I suspect that the fluid is due to her being chronically in a seated position for the past week.  The patient is in a rapid rate, most likely a flutter with 2-1 block.  No history of the same.  Will obtain labs chest x-ray urine.  Patient spontaneously converted into normal sinus rhythm on the ED.  She feels much better.  This is fairly consistent with the thought that she was having symptoms secondary to her rapid heart rate.  Chest x-ray was negative for pneumonia.  She does have a leukocytosis.  She has a lactate that is 5.2.  I think this lactate is secondary to this persistent tachycardia that she had.  The patient is noted to have a MAP's <65/ SBP's <90. With the current  information available to me, I don't think the patient is in septic shock. The MAP's <65/ SBP's <90, is related to reading is invalid or erroneous.  The patient has an abnormal upper arm shape.  The blood pressure would change sporadically between readings.  The patient always was alert and oriented with no symptoms consistent with hypotension.    Will discuss with hospitalist.   Chadsvasc score of 5. Started on heparin in the ED, expect she will be sent to the afib clinic.   CRITICAL CARE Performed by: DCecilio Asper  Total critical care time: 80 minutes  Critical care time was exclusive of separately billable procedures and treating other patients.  Critical care was necessary to treat or prevent imminent or life-threatening deterioration.  Critical care was time spent personally by me on the following activities: development of treatment plan with patient and/or surrogate as well as nursing, discussions with consultants, evaluation of patient's response to treatment, examination of patient, obtaining history from patient or surrogate, ordering and performing treatments and interventions, ordering and review of laboratory studies, ordering and review of radiographic studies, pulse oximetry and re-evaluation of patient's condition.  The patients results and plan were reviewed and discussed.   Any x-rays performed were independently reviewed by myself.   Differential diagnosis were considered with the presenting HPI.  Medications  diltiazem (CARDIZEM) 1 mg/mL load via infusion 20 mg (20 mg Intravenous Bolus from Bag 08/07/17 1941)    And  diltiazem (CARDIZEM) 100 mg in dextrose 5 % 100 mL (1 mg/mL) infusion (0 mg/hr Intravenous Stopped 08/07/17 2120)  heparin bolus  via infusion 1,000 Units (not administered)  heparin ADULT infusion 100 units/mL (25000 units/224m sodium chloride 0.45%) (not administered)  acetaminophen (TYLENOL) tablet 1,000 mg (not administered)  sodium  chloride 0.9 % bolus 500 mL (0 mLs Intravenous Stopped 08/07/17 2137)  sodium chloride 0.9 % bolus 1,000 mL (1,000 mLs Intravenous New Bag/Given 08/07/17 2120)    Vitals:   08/07/17 2100 08/07/17 2115 08/07/17 2145 08/07/17 2200  BP: 113/68 (!) 83/64 103/83   Pulse:   96   Resp:  12 (!) 21   Temp:      TempSrc:      SpO2:   94%   Weight:    72.6 kg (160 lb)  Height:    _0  (1.295 m)    Final diagnoses:  Typical atrial flutter (HCC)  Weakness  Dehydration  AKI (acute kidney injury) (HCC)  Lactic acidosis    Admission/ observation were discussed with the admitting physician, patient and/or family and they are comfortable with the plan.    Final Clinical Impressions(s) / ED Diagnoses   Final diagnoses:  Typical atrial flutter (HCC)  Weakness  Dehydration  AK[place] (acute kidney injury) ([place]  Lactic acidosis    ED Discharge Orders    None           FDeno Etienne DO 08/07/17 2303

## 2017-08-07 NOTE — Progress Notes (Signed)
ANTICOAGULATION CONSULT NOTE - Initial Consult  Pharmacy Consult for heparin Indication: atrial fibrillation  No Known Allergies  Patient Measurements: Height: 4\' 3"  (129.5 cm) Weight: 160 lb (72.6 kg) IBW/kg (Calculated) : 24.8 Heparin Dosing Weight: 45kg  Vital Signs: Temp: 97.5 F (36.4 C) (11/25 1858) Temp Source: Oral (11/25 1858) BP: 103/83 (11/25 2145) Pulse Rate: 96 (11/25 2145)  Labs: Recent Labs    08/07/17 1953 08/07/17 2023  HGB 11.4* 13.6  HCT 34.5* 40.0  PLT 256  --   CREATININE 2.27* 2.20*    Estimated Creatinine Clearance: 12 mL/min (A) (by C-G formula based on SCr of 2.2 mg/dL (H)).   Medical History: Past Medical History:  Diagnosis Date  . Chronic pain syndrome 1997  . HTN (hypertension)   . Renal disorder   . Sickle cell trait ([place])   . Thyroid disease     Assessment: 83yo female c/o weakness and fluid overload, found to be in Aflutter, to begin heparin.  Goal of Therapy:  Heparin level 0.3-0.7 units/ml Monitor platelets by anticoagulation protocol: Yes   Plan:  Will give heparin 1000 units IV bolus x1 followed by gtt at 700 units/hr and monitor heparin levels and CBC.  Wynona Neat, PharmD, BCPS  08/07/2017,10:46 PM

## 2017-08-07 NOTE — ED Triage Notes (Signed)
To room via EMS from home.  Pt reports she has been sitting in chair x 5 days, frequent urination, stopped lasix for few days d/t frequent urination, re-started lasix today.  Pt usually walks with walker but unable to ambulate for past 5 days.  Decreased appetite x 3 days, Adequate PO fluid intake. A&O x 4.

## 2017-08-07 NOTE — ED Notes (Signed)
QNS to run Istat blood test.  Will try again after xray leave room.

## 2017-08-07 NOTE — ED Notes (Signed)
Attempted I/O cath, unsuccessful attempts.  MD made aware.

## 2017-08-07 NOTE — ED Notes (Signed)
On bedpan at 21:00 (did not wish to use In and Out Cath and stated she could pee by herself but that it might take a little bit for her to get started).

## 2017-08-08 ENCOUNTER — Encounter (HOSPITAL_COMMUNITY): Payer: Medicare Other

## 2017-08-08 ENCOUNTER — Inpatient Hospital Stay (HOSPITAL_COMMUNITY): Payer: Medicare Other

## 2017-08-08 DIAGNOSIS — I249 Acute ischemic heart disease, unspecified: Secondary | ICD-10-CM | POA: Diagnosis present

## 2017-08-08 DIAGNOSIS — I10 Essential (primary) hypertension: Secondary | ICD-10-CM

## 2017-08-08 DIAGNOSIS — I483 Typical atrial flutter: Secondary | ICD-10-CM | POA: Diagnosis not present

## 2017-08-08 DIAGNOSIS — R748 Abnormal levels of other serum enzymes: Secondary | ICD-10-CM | POA: Diagnosis not present

## 2017-08-08 LAB — COMPREHENSIVE METABOLIC PANEL
ALBUMIN: 2.7 g/dL — AB (ref 3.5–5.0)
ALT: 191 U/L — ABNORMAL HIGH (ref 14–54)
AST: 294 U/L — AB (ref 15–41)
Alkaline Phosphatase: 88 U/L (ref 38–126)
Anion gap: 9 (ref 5–15)
BUN: 36 mg/dL — AB (ref 6–20)
CHLORIDE: 111 mmol/L (ref 101–111)
CO2: 18 mmol/L — ABNORMAL LOW (ref 22–32)
Calcium: 8.4 mg/dL — ABNORMAL LOW (ref 8.9–10.3)
Creatinine, Ser: 2.03 mg/dL — ABNORMAL HIGH (ref 0.44–1.00)
GFR calc Af Amer: 24 mL/min — ABNORMAL LOW (ref >60)
GFR, EST NON AFRICAN AMERICAN: 21 mL/min — AB (ref >60)
Glucose, Bld: 86 mg/dL (ref 65–99)
POTASSIUM: 4.2 mmol/L (ref 3.5–5.1)
Sodium: 138 mmol/L (ref 135–145)
Total Bilirubin: 0.7 mg/dL (ref 0.3–1.2)
Total Protein: 6.8 g/dL (ref 6.5–8.1)

## 2017-08-08 LAB — HEPARIN LEVEL (UNFRACTIONATED)
Heparin Unfractionated: 0.12 IU/mL — ABNORMAL LOW (ref 0.30–0.70)
Heparin Unfractionated: 0.29 IU/mL — ABNORMAL LOW (ref 0.30–0.70)

## 2017-08-08 LAB — CBC
HCT: 30 % — ABNORMAL LOW (ref 36.0–46.0)
Hemoglobin: 9.6 g/dL — ABNORMAL LOW (ref 12.0–15.0)
MCH: 31.2 pg (ref 26.0–34.0)
MCHC: 32 g/dL (ref 30.0–36.0)
MCV: 97.4 fL (ref 78.0–100.0)
PLATELETS: 272 10*3/uL (ref 150–400)
RBC: 3.08 MIL/uL — ABNORMAL LOW (ref 3.87–5.11)
RDW: 13.2 % (ref 11.5–15.5)
WBC: 12.7 10*3/uL — AB (ref 4.0–10.5)

## 2017-08-08 LAB — TSH: TSH: 11.842 u[IU]/mL — AB (ref 0.350–4.500)

## 2017-08-08 LAB — TROPONIN I
TROPONIN I: 1.23 ng/mL — AB (ref <0.03)
TROPONIN I: 1.66 ng/mL — AB (ref <0.03)
Troponin I: 1.04 ng/mL (ref <0.03)

## 2017-08-08 LAB — URIC ACID: Uric Acid, Serum: 8.3 mg/dL — ABNORMAL HIGH (ref 2.3–6.6)

## 2017-08-08 MED ORDER — HEPARIN (PORCINE) IN NACL 100-0.45 UNIT/ML-% IJ SOLN
850.0000 [IU]/h | INTRAMUSCULAR | Status: DC
  Administered 2017-08-08: 800 [IU]/h via INTRAVENOUS
  Administered 2017-08-09: 850 [IU]/h via INTRAVENOUS
  Filled 2017-08-08: qty 250

## 2017-08-08 MED ORDER — CLONIDINE HCL 0.1 MG PO TABS
0.1000 mg | ORAL_TABLET | Freq: Every day | ORAL | Status: DC
  Administered 2017-08-08: 0.1 mg via ORAL
  Filled 2017-08-08: qty 1

## 2017-08-08 MED ORDER — CLONIDINE HCL 0.1 MG PO TABS
0.0500 mg | ORAL_TABLET | Freq: Two times a day (BID) | ORAL | Status: DC
Start: 2017-08-08 — End: 2017-08-09
  Administered 2017-08-08 – 2017-08-09 (×2): 0.05 mg via ORAL
  Filled 2017-08-08 (×2): qty 1

## 2017-08-08 MED ORDER — LEVOTHYROXINE SODIUM 100 MCG PO TABS
100.0000 ug | ORAL_TABLET | Freq: Every day | ORAL | Status: DC
  Administered 2017-08-08 – 2017-08-09 (×2): 100 ug via ORAL
  Filled 2017-08-08 (×2): qty 1

## 2017-08-08 MED ORDER — HEPARIN BOLUS VIA INFUSION
1000.0000 [IU] | Freq: Once | INTRAVENOUS | Status: AC
  Administered 2017-08-08: 1000 [IU] via INTRAVENOUS
  Filled 2017-08-08: qty 1000

## 2017-08-08 MED ORDER — PRAVASTATIN SODIUM 40 MG PO TABS
40.0000 mg | ORAL_TABLET | Freq: Every day | ORAL | Status: DC
  Administered 2017-08-08: 40 mg via ORAL
  Filled 2017-08-08 (×2): qty 1

## 2017-08-08 MED ORDER — CLONIDINE HCL 0.1 MG PO TABS: 0.1000 mg | ORAL_TABLET | Freq: Three times a day (TID) | ORAL | Status: DC

## 2017-08-08 MED ORDER — PANTOPRAZOLE SODIUM 40 MG PO TBEC
40.0000 mg | DELAYED_RELEASE_TABLET | Freq: Every day | ORAL | Status: DC
  Administered 2017-08-08 – 2017-08-09 (×2): 40 mg via ORAL
  Filled 2017-08-08 (×2): qty 1

## 2017-08-08 MED ORDER — ACETAMINOPHEN 325 MG PO TABS
650.0000 mg | ORAL_TABLET | Freq: Four times a day (QID) | ORAL | Status: DC | PRN
  Administered 2017-08-08 – 2017-08-09 (×3): 650 mg via ORAL
  Filled 2017-08-08 (×3): qty 2

## 2017-08-08 NOTE — Progress Notes (Signed)
PT Cancellation Note  Patient Details Name: Emily Campos MRN: [id number] DOB: 04-05-1926   Cancelled Treatment:    Reason Eval/Treat Not Completed: Medical issues which prohibited therapy Pt's troponin elevated and has Cardiology consult. Will hold until cardiology sees pt and reattept as schedule allows.   Leighton Ruff, PT, DPT  Acute Rehabilitation Services  Pager: [phone]     Rudean Hitt 08/08/2017, 10:50 AM

## 2017-08-08 NOTE — Progress Notes (Signed)
Patient's granddaughter, Katelynne, has called for an update. Patient gave this RN permission to speak with granddaughter regarding status and POC. Granddaughter states that patient living conditions are poor, does not receive adequate food, and that her residence is "infested with bed bugs." Occupation therapists states that she did see a bug in pt's room but says the pt got rid of bug before she could look at it. Granddaughter states that she was planning to take pt to [place] to live with her this week before she came to the hospital.  Granddaughter wanted staff to dispose of patient's belongings due to bed bugs but informed that we could not do that. Belongings sealed in biohazard bag instead.  Case management made aware of poor living conditions.

## 2017-08-08 NOTE — Progress Notes (Signed)
ANTICOAGULATION CONSULT NOTE   Pharmacy Consult for heparin Indication: atrial fibrillation  No Known Allergies  Patient Measurements: Height: 4\' 3"  (129.5 cm) Weight: 160 lb (72.6 kg) IBW/kg (Calculated) : 24.8 Heparin Dosing Weight: 45kg  Vital Signs: Temp: 98.1 F (36.7 C) (11/26 1940) Temp Source: Oral (11/26 1940) BP: 156/68 (11/26 1940) Pulse Rate: 67 (11/26 1940)  Labs: Recent Labs    08/07/17 1953 08/07/17 2023 08/08/17 0033 08/08/17 0645 08/08/17 1832  HGB 11.4* 13.6  --  9.6*  --   HCT 34.5* 40.0  --  30.0*  --   PLT 256  --   --  272  --   HEPARINUNFRC  --   --   --  0.12* 0.29*  CREATININE 2.27* 2.20*  --  2.03*  --   CKTOTAL 247*  --   --   --   --   TROPONINI  --   --  1.66* 1.23* 1.04*    Estimated Creatinine Clearance: 13 mL/min (A) (by C-G formula based on SCr of 2.03 mg/dL (H)).   Medical History: Past Medical History:  Diagnosis Date  . Chronic pain syndrome 1997  . HTN (hypertension)   . Renal disorder   . Sickle cell trait ([place])   . Thyroid disease     Assessment: 83yo female c/o weakness and fluid overload, found to be in Aflutter, started on IV heparin Heparin level = 0.29, still slightly subtherapeutic after rate increased to 800 units/hr.  Goal of Therapy:  Heparin level 0.3-0.7 units/ml Monitor platelets by anticoagulation protocol: Yes   Plan:  Increase heparin infusion rate slightly to 850 units/hr.  F/u AM labs  Maryanna Shape, PharmD, BCPS  Clinical Pharmacist  Pager: [phone]   08/08/2017,7:57 PM

## 2017-08-08 NOTE — ED Notes (Signed)
Attempted report 

## 2017-08-08 NOTE — H&P (Signed)
[facility] Admission History and Physical Service Pager: [phone]  Patient name: Emily Campos Medical record number: [id number] Date of birth: 1926/04/26 Age: 83 y.o. Gender: female  Primary Care Provider: Verner Mould, MD Consultants: Cardiology Code Status: Full  Chief Complaint:   Extreme Fatigue for 5 days  Assessment and Plan: Emily Campos is a 83 y.o. female presenting with extreme fatigue for 5 days. PMH is significant for HTN, HLD, Hypothyroidism, OA, Chronic low back pain, CHF (HFpEF), CKD III, and GERD.  Fatigue: Patient had atrial flutter on presentation to the ED which was likely contributing to her symptoms of fatigue vs. ACS. Noted to have an elevated lactic acid of 5.19 and WBC 14.4.  Patient denied any infectious symptoms.  She was afebrile.  Lactic acidosis completely resolved on repeat following rehydration. Given how well-appearing patient and the quick resolution of lactic acid, will not treat for sepsis. CXR was negative for any acute cardiopulmonary process. Possibly ACS contributing to patient's fatigue, initial troponin 0.59, though repeat EKG though without ST elevation or depression when arrhythmia controlled.  Patient did endorse shortness of breath specifically associated with "heartburn" symptoms. Will workup for ACS for now, hold off on any further sepsis workup for now.  If patient shows signs or symptoms of meeting septic criteria will start workup immediately and placed on antibiotics  - Admit to telemetry, Dr. Erin Hearing - CBC, CMP in am - BNP pending -Troponin 0.59, 1.12, following -Trend troponins and a.m. EKG -Lipid panel and TSH -Cardiology consult -Consult PT/OT  - Evaluate for sacral ulcer   Atrial Flutter: Atrial Flutter has resolved with dilt drip. Patient is on heparin per cards. Potential causes include ACS or worsening CHF. Not showing in signs of CHF exacerbation; continue to monitor. -  Cardiology consulted, appreciate recs - Continue diltiazem drip - ACS rule out with risk factors; HbA1c,TSH, lipid panel - Repeat EKG in am - On Heparin per pharmacy  AKI: SCr 2.20 Baseline 1.5. Received NS bolus x1 - Follow up SCR - Consider further work up if no improvement   Right Lower extremity Edema: Chronic in nature. Has been evaluated twice for blood clot in August and June both were negative.  Patient does note some pain in toes, thought no ulceration. Per Dr. Avon Gully has seen patient in the past, thought foot pain was cause by neuropathy  - Will obtain an ABI  Slightly Elevated Liver function: AST 360/ALT262  - Will check hepatitis panel  - Continue to follow   Hypothyroidism: Patient has had hypothyroidism and been well controlled in the past. - Continue Levothyroxine 135mcg - TSH pending  HTN: BP since admission has been reasonably well controlled. Continue to monitor. Clonidine 0.1mg  TID  - Reduced dose of Clonidine 0.1mg  daily  HLD: Last lipid panel was 2016 and was significant for elevated cholesterol and LDL. - Continue home pravastatin  - Lipid panel    FEN/GI: full diet Prophylaxis: Heparin  Disposition: Telemetry  History of Present Illness:  Emily Campos is a 83 y.o. female presenting with extreme fatigue for 5 days. She states about 5 days ago she was too tired to get up so she sat down in her chair and had not gotten up until today. She states her grandson has stopped by a few days ago and gave her some water and ice cream. She states he also came by for Thanksgiving and she had just a little bit of food but could not tolerate eating a lot  b/c of her "heart burn". She complains of bilateral foot pain and think it is due to gout. She also endorses coughing and SOB but only when her "heart burn" occurs. She describes the pain as coming from her lower legs all the way up to her throat. A neighbor stopped by her place today b/c she had not seen her and  called EMS for her to come to the hospital.  Patient presenting with a flutter in the ED.  Was started on diltiazem and heparin drip by the ED provider and repeat EKG showed rhythm had resolved.  Review Of Systems: Per HPI with the following additions:   Review of Systems  Constitutional: Negative for chills, diaphoresis and fever.  HENT: Negative for congestion, sinus pain and sore throat.   Respiratory: Positive for cough and shortness of breath. Negative for sputum production and wheezing.   Cardiovascular: Positive for chest pain. Negative for orthopnea and PND.  Gastrointestinal: Positive for heartburn, nausea and vomiting. Negative for abdominal pain, constipation and diarrhea.  Genitourinary: Negative for dysuria, frequency and urgency.  Neurological: Positive for weakness.    Patient Active Problem List   Diagnosis Date Noted  . ACS (acute coronary syndrome) ([place]) 08/08/2017  . Cough 06/10/2017  . Bilateral leg and foot pain 06/09/2017  . Chronic venous insufficiency 05/04/2017  . Sore throat 10/05/2016  . GERD (gastroesophageal reflux disease) 10/05/2016  . Edema of right lower extremity 04/27/2016  . Chronic rhinosinusitis 09/17/2015  . Chronic pain syndrome 09/17/2015  . Hypertensive heart disease 05/22/2015  . Hypertensive kidney and heart disease without congestive heart failure, stage III ([place]) 05/22/2015  . Hyperlipidemia 03/19/2015  . CKD (chronic kidney disease) stage 3, GFR 30-59 ml/min ([facility]) 09/26/2014  . HTN (hypertension) 09/25/2014  . Hypothyroidism 09/25/2014  . Chronic back pain 09/25/2014  . Osteoarthritis of both knees 09/25/2014  . Chronic diastolic CHF (congestive heart failure), NYHA class 2 ([place]) 09/25/2014    Past Medical History: Past Medical History:  Diagnosis Date  . Chronic pain syndrome 1997  . HTN (hypertension)   . Renal disorder   . Sickle cell trait ([place])   . Thyroid disease     Past Surgical History: Past Surgical History:   Procedure Laterality Date  . APPENDECTOMY    . BACK SURGERY  1997    Social History: Social History   Tobacco Use  . Smoking status: Never Smoker  . Smokeless tobacco: Never Used  Substance Use Topics  . Alcohol use: No    Alcohol/week: 0.0 oz  . Drug use: No   Additional social history: none Please also refer to relevant sections of EMR.  Family History: Family History  Problem Relation Age of Onset  . Heart disease Mother   . High blood pressure Mother     Allergies and Medications: No Known Allergies No current facility-administered medications on file prior to encounter.    Current Outpatient Medications on File Prior to Encounter  Medication Sig Dispense Refill  . benzonatate (TESSALON) 100 MG capsule Take 1 capsule (100 mg total) by mouth every 8 (eight) hours. 21 capsule 0  . cloNIDine (CATAPRES) 0.1 MG tablet Take 1 tablet (0.1 mg total) by mouth 3 (three) times daily. 270 tablet 3  . cyclobenzaprine (FLEXERIL) 5 MG tablet Take 1 tablet (5 mg total) by mouth daily as needed for muscle spasms. (Patient taking differently: Take 2.5 mg by mouth daily as needed for muscle spasms. ) 15 tablet 2  . enalapril (VASOTEC) 20 MG  tablet Take 1 tablet (20 mg total) by mouth daily. 90 tablet 3  . furosemide (LASIX) 20 MG tablet Take 2 tablets (40 mg total) by mouth daily. 180 tablet 3  . gabapentin (NEURONTIN) 100 MG capsule Take 1 capsule (100 mg total) by mouth 3 (three) times daily. 90 capsule 3  . levothyroxine (SYNTHROID, LEVOTHROID) 100 MCG tablet take 1 tablet by mouth once daily BEFORE BREAKFAST 90 tablet 3  . potassium chloride (K-DUR) 10 MEQ tablet take 1 tablet by mouth once daily 90 tablet 3  . pravastatin (PRAVACHOL) 40 MG tablet Take 1 tablet (40 mg total) by mouth at bedtime. 90 tablet 3  . ranitidine (ZANTAC) 150 MG tablet take 1 tablet by mouth twice a day 60 tablet 2    Objective: BP 128/78   Pulse 96   Temp (!) 97.5 F (36.4 C) (Oral)   Resp 18   Ht 4'  3" (1.295 m)   Wt 160 lb (72.6 kg)   SpO2 94%   BMI 43.25 kg/m  Exam: Gen: Alert and Oriented x 3, NAD HEENT: Normocephalic, atraumatic, PERRLA, EOMI, no exudates, normal pharyngeal mucosa, poor dentition  Neck: trachea midline, no thyroidmegaly, no LAD CV: RRR, no murmurs, normal S1, S2 split Resp: CTAB, no wheezing, rales, or rhonchi, comfortable work of breathing Abd: non-distended, non-tender, soft, +bs in all four quadrants, no hepatomegaly, umbilical hernia that is non-tender, reducible MSK: limited ROM in all four extremities Ext: no clubbing, cyanosis, right leg +2 pitting edema Neuro: CN II-XII intact, no focal or gross deficits Skin: warm, dry, intact, no rashes Psych: appropriate behavior, mood   Labs and Imaging: CBC BMET  Recent Labs  Lab 08/07/17 1953 08/07/17 2023  WBC 14.4*  --   HGB 11.4* 13.6  HCT 34.5* 40.0  PLT 256  --    Recent Labs  Lab 08/07/17 1953 08/07/17 2023  NA 132* 136  K 4.0 4.9  CL 104 113*  CO2 17*  --   BUN 34* 38*  CREATININE 2.27* 2.20*  GLUCOSE 146* 141*  CALCIUM 8.5*  --      11/26 - EKG: Atrial flutter, repeat EKG showed NSR 11/26 - Troponin I: 059 > 1.12 11/26 - Lactic Acid: 5.19 > 1.43 11/26 - CK: 247 11/26 - U/A: moderate hgb otherwise wnl 11/26 - Urine Cx: pending 11/26 - BCx: pending 11/26 - BNP: pending  Dg Chest 2 View  Result Date: 08/08/2017 CLINICAL DATA:  Shortness of breath today. EXAM: CHEST  2 VIEW COMPARISON:  08/07/2017 FINDINGS: Shallow inspiration with linear atelectasis in the left lung base. Heart size and pulmonary vascularity are normal for technique. Tortuous aorta. Generator pack projected over the left mid chest. No blunting of costophrenic angles. No pneumothorax. Mediastinal contours appear intact. IMPRESSION: Shallow inspiration with linear atelectasis in the left lung base. Electronically Signed   By: Lucienne Capers M.D.   On: 08/08/2017 01:17   Dg Chest Portable 1 View  Result Date:  08/07/2017 CLINICAL DATA:  Patient with weakness. EXAM: PORTABLE CHEST 1 VIEW COMPARISON:  Chest radiograph 04/15/2016. FINDINGS: Monitoring leads overlie the patient. Stable cardiac and mediastinal contours. Tortuosity of the thoracic aorta. No consolidative pulmonary opacities. No pleural effusion or pneumothorax. IMPRESSION: No acute cardiopulmonary process. Electronically Signed   By: Lovey Newcomer M.D.   On: 08/07/2017 21:32    Nuala Alpha, DO 08/08/2017, 12:48 AM PGY-1, [place] Intern pager: [phone], text pages welcome  UPPER LEVEL ADDENDUM  I have  read the above note and made revisions highlighted in blue.  Kerrin Mo, MD, PGY-3 [facility]

## 2017-08-08 NOTE — ED Provider Notes (Signed)
Admitting Family Practice Resident advised of elevated troponin.    Charlann Lange, PA-C 08/08/17 [place], [place], DO 08/11/17 [phone]

## 2017-08-08 NOTE — Progress Notes (Signed)
MD notified regarding phlebotomy being unable to obtain blood for labs. Pt without CP. Per MD, will need IV team to obtain blood if CP occurs.

## 2017-08-08 NOTE — Progress Notes (Signed)
Family Medicine Teaching Service Daily Progress Note Intern Pager: [phone]  Patient name: Emily Campos Medical record number: [id number] Date of birth: 05-09-1926 Age: 83 y.o. Gender: female  Primary Care Provider: Verner Mould, MD Consultants: Cardiology Code Status: Full  Pt Overview and Major Events to Date:  11/26 - admitted for fatigue, ACS r/o  Assessment and Plan: Zona Pedro is a 83 y.o. female presenting with extreme fatigue for 5 days with a cough. PMH is significant for CAD, HTN, HLD, Hypothyroidism, OA, Chronic low back pain, CHF (HFpEF), CKD III, and GERD.  Fatigue, ACS r/o: Patient had atrial flutter on presentation to the ED that has resolved that could have been contributing to her symptoms of fatigue. istat Trop 0.59>1.12, trop 1.66. CK 247. Repeat EKG in ED without ST elevation or depression once arrhythmia controlled. Patient had elevated Lactic Acid 5.1>1.43 with fluids and WBC 14.4>12.7 which both trended down with fluids. TSH 11.842 11/26. Has been afebrile with no suspected source of infection, however blood and urine cultures drawn in ED. CXR negative for any acute process. CHF exacerbation seems less likely as she is not having any SOB or increased work of breathing, cough is mild and intermittent, and she has only right leg edema that is chronic.  Fatigue most likely brought on by atrial flutter. ACS r/o underway to rule out demand ischemia causing atrial flutter. - BNP pending - trend trops - Cardiology consulted, appreciate recs - ankle-brachial index to investigate right lower leg edema - lipid panel - PT/OT  Atrial Flutter: Atrial Flutter has resolved with dilt drip. Patient is on heparin per cards. Potential causes include ACS or worsening CHF. Not showing in signs of CHF exacerbation; continue to monitor. - Cardiology consulted, appreciate recs - Continue diltiazem drip - ACS rule out with risk factors; HbA1c,TSH, lipid panel -  Repeat EKG in am - On Heparin per pharmacy  AKI Cr 2.27, baseline ~1.5-1.9. Patient states she "almost had to have her kidneys on a machine when she was at home" in [place].  Right Lower extremity Edema: Chronic in nature. Has been evaluated twice for blood clot in August and June both were negative.  Patient does note some pain in toes, though no ulceration. Per Dr. Avon Gully has seen patient in the past, thought foot pain was caused by neuropathy. Does not c/o tingling on exam this am.  - Will obtain an ABI  Slightly Elevated Liver function: AST 360/ALT262.  - Will check hepatitis panel  - Continue to follow   Hypothyroidism: Patient has had hypothyroidism and been well controlled in the past. TSH 11.842 this am. Of note, patient has not been consistent with meds the past week. - Continue Levothyroxine 148mcg  HTN: BP since admission has been reasonably well controlled. Continue to monitor. On Clonidine 0.1mg  TID at home - Clonidine 0.1mg  daily  HLD: Last lipid panel was 2016 and was significant for elevated cholesterol and LDL. - Continue home pravastatin  - Lipid panel 40mg   FEN/GI: full diet Prophylaxis: Heparin  Disposition: continue inpatient workup of ACS r/o  Subjective:  Patient states initial gout and heartburn pain have relieved somewhat, still complaining of leg pain.  Objective: Temp:  [97.5 F (36.4 C)-98.1 F (36.7 C)] 97.8 F (36.6 C) (11/26 0852) Pulse Rate:  [57-154] 62 (11/26 0852) Resp:  [8-21] 14 (11/26 0852) BP: (83-143)/(44-104) 143/66 (11/26 0852) SpO2:  [94 %-100 %] 98 % (11/26 0852) Weight:  [160 lb (72.6 kg)] 160 lb (72.6 kg) (11/25  2200)  Physical Exam: General: elderly female lying in bed, in NAD Cardiovascular: RRR, no murmurs/gallops Respiratory: CTA bilaterally, some coarse wheezing in bases however could be upper airway noises transmitted Abdomen: soft, NTND, +BS, ventral hernia present and soft and somewhat  reducible Extremities: R leg with nonpitting edema, no DP pulses appreciated, warm  Laboratory: Recent Labs  Lab 08/07/17 1953 08/07/17 2023 08/08/17 0645  WBC 14.4*  --  12.7*  HGB 11.4* 13.6 9.6*  HCT 34.5* 40.0 30.0*  PLT 256  --  272   Recent Labs  Lab 08/07/17 1953 08/07/17 2023 08/08/17 0645  NA 132* 136 138  K 4.0 4.9 4.2  CL 104 113* 111  CO2 17*  --  18*  BUN 34* 38* 36*  CREATININE 2.27* 2.20* 2.03*  CALCIUM 8.5*  --  8.4*  PROT 7.5  --  6.8  BILITOT 0.6  --  0.7  ALKPHOS 91  --  88  ALT 262*  --  191*  AST 360*  --  294*  GLUCOSE 146* 141* 86   11/26 EKG - sinus bradycardia  Imaging/Diagnostic Tests: Dg Chest 2 View  Result Date: 08/08/2017 CLINICAL DATA:  Shortness of breath today. EXAM: CHEST  2 VIEW COMPARISON:  08/07/2017 FINDINGS: Shallow inspiration with linear atelectasis in the left lung base. Heart size and pulmonary vascularity are normal for technique. Tortuous aorta. Generator pack projected over the left mid chest. No blunting of costophrenic angles. No pneumothorax. Mediastinal contours appear intact. IMPRESSION: Shallow inspiration with linear atelectasis in the left lung base. Electronically Signed   By: Lucienne Capers M.D.   On: 08/08/2017 01:17   Dg Chest Portable 1 View  Result Date: 08/07/2017 CLINICAL DATA:  Patient with weakness. EXAM: PORTABLE CHEST 1 VIEW COMPARISON:  Chest radiograph 04/15/2016. FINDINGS: Monitoring leads overlie the patient. Stable cardiac and mediastinal contours. Tortuosity of the thoracic aorta. No consolidative pulmonary opacities. No pleural effusion or pneumothorax. IMPRESSION: No acute cardiopulmonary process. Electronically Signed   By: Lovey Newcomer M.D.   On: 08/07/2017 21:32   Rory Percy, DO 08/08/2017, 9:29 AM PGY-1, [place] Intern pager: [phone], text pages welcome

## 2017-08-08 NOTE — Consult Note (Signed)
Cardiology Consult    Patient ID: Emily Campos MRN: [id number], DOB/AGE: Jan 25, 1926   Admit date: 08/07/2017 Date of Consult: 08/08/2017  Primary Physician: Verner Mould, MD Primary Cardiologist: Dr. Irish Lack Requesting Provider: Dr. Ky Barban  Reason for Consult: troponin elevation  Patient Profile    Emily Campos is a 83 y.o. female who is being seen today for the evaluation of elevation of troponin at the request of Dr. Ky Barban. She has a PMH of CAD, HTN, HLD, dCHF, CKD stage 3, GERD, Hypothyroidism who presented to [facility] today with fatigue and found to be in atrial flutter (arythmia resolved with diltiazem drip). She had elevated troponins, was placed on heparin and cardiology was consulted.  Past Medical History   Past Medical History:  Diagnosis Date  . Chronic pain syndrome 1997  . HTN (hypertension)   . Renal disorder   . Sickle cell trait ([place])   . Thyroid disease     Past Surgical History:  Procedure Laterality Date  . APPENDECTOMY    . BACK SURGERY  1997    Allergies  No Known Allergies  History of Present Illness    Emily Campos is an 83yo female with PMH of CAD, HTN, HLD, hypothyroidism, dCHF, CKD stage 3 and GERD presenting to [facility] with 4 day history of extreme fatigue. On admission she was found to be in atrial flutter which was resolved with diltiazem drip. Her troponins have trended up 0.59>1.12>1.66>1.23; EKG without ST segment deviations. Her TSH was found to be elevated to 11.8 (inconsistent with meds last few days).  Patient states that she had a gout attack a few days ago which severely limited her mobility and led to her feeling generally fatigued over the last couple of days to the point of urinating on herself from not being able to get up. She also endorses some vomiting over the weekend which sounds like regurgitation. She endorses poor intake and poor medication compliance due to intermittent odynophagia and  dysphagia.  She denies chest pain, palpitations; she endorses some shortness of breath only when she has heartburn. Patient states that a cardiologist had at one point suggested a pace-maker, but she declined - she denies that it was for bradycardia or arrhythmia. She denies prior cardiac disease, early deaths in her family; she is a never smoker. Patient lives by herself, has grandson who checks in on her occasionally.  Inpatient Medications    . cloNIDine  0.1 mg Oral Daily  . levothyroxine  100 mcg Oral QAC breakfast  . pravastatin  40 mg Oral QHS   Outpatient Medications    Prior to Admission medications   Medication Sig Start Date End Date Taking? Authorizing Provider  benzonatate (TESSALON) 100 MG capsule Take 1 capsule (100 mg total) by mouth every 8 (eight) hours. 06/09/17  Yes Verner Mould, MD  cloNIDine (CATAPRES) 0.1 MG tablet Take 1 tablet (0.1 mg total) by mouth 3 (three) times daily. 06/09/17  Yes Verner Mould, MD  cyclobenzaprine (FLEXERIL) 5 MG tablet Take 1 tablet (5 mg total) by mouth daily as needed for muscle spasms. Patient taking differently: Take 2.5 mg by mouth daily as needed for muscle spasms.  06/09/17  Yes Verner Mould, MD  enalapril (VASOTEC) 20 MG tablet Take 1 tablet (20 mg total) by mouth daily. 06/09/17  Yes Verner Mould, MD  furosemide (LASIX) 20 MG tablet Take 2 tablets (40 mg total) by mouth daily. 06/09/17  Yes Verner Mould, MD  gabapentin (NEURONTIN)  100 MG capsule Take 1 capsule (100 mg total) by mouth 3 (three) times daily. 06/09/17  Yes Verner Mould, MD  levothyroxine (SYNTHROID, LEVOTHROID) 100 MCG tablet take 1 tablet by mouth once daily BEFORE BREAKFAST 04/29/17  Yes Verner Mould, MD  potassium chloride (K-DUR) 10 MEQ tablet take 1 tablet by mouth once daily 04/29/17  Yes Verner Mould, MD  pravastatin (PRAVACHOL) 40 MG tablet Take 1 tablet (40 mg total) by  mouth at bedtime. 06/09/17  Yes Verner Mould, MD  ranitidine [facility]) 150 MG tablet take 1 tablet by mouth twice a day 07/01/17  Yes Verner Mould, MD   Family History    Family History  Problem Relation Age of Onset  . Heart disease Mother   . High blood pressure Mother    Social History    Social History   Socioeconomic History  . Marital status: Single    Spouse name: Not on file  . Number of children: Not on file  . Years of education: Not on file  . Highest education level: Not on file  Social Needs  . Financial resource strain: Not on file  . Food insecurity - worry: Not on file  . Food insecurity - inability: Not on file  . Transportation needs - medical: Not on file  . Transportation needs - non-medical: Not on file  Occupational History  . Not on file  Tobacco Use  . Smoking status: Never Smoker  . Smokeless tobacco: Never Used  Substance and Sexual Activity  . Alcohol use: No    Alcohol/week: 0.0 oz  . Drug use: No  . Sexual activity: Not Currently    Comment: not sexually active since 1980  Other Topics Concern  . Not on file  Social History Narrative  . Not on file    Review of Systems   All other systems reviewed and are otherwise negative except as noted above.  Physical Exam    Blood pressure (!) 143/66, pulse 62, temperature 97.8 F (36.6 C), temperature source Oral, resp. rate 14, height 4\' 3"  (1.295 m), weight 160 lb (72.6 kg), SpO2 98 %.  General: Pleasant, NAD, falls asleep intermittently during interview Psych: Normal affect. Neuro: Alert and oriented X 3. Moves all extremities spontaneously. HEENT: moist mucous membranes; very poor dentition  Neck: Supple without bruits or JVD. Lungs:  Resp regular and unlabored, CTAB. Heart: RRR no s3, s4, or murmurs. Abdomen: Soft, non-tender, non-distended, +BS.  Extremities: Pitting edema in RLE, PT pulses intact bil  Labs    Troponin (Point of Care Test) Recent Labs     08/07/17 2350  TROPIPOC 1.12*   Recent Labs    08/07/17 1953 08/08/17 0033 08/08/17 0645  CKTOTAL 247*  --   --   TROPONINI  --  1.66* 1.23*   Lab Results  Component Value Date   WBC 12.7 (H) 08/08/2017   HGB 9.6 (L) 08/08/2017   HCT 30.0 (L) 08/08/2017   MCV 97.4 08/08/2017   PLT 272 08/08/2017    Recent Labs  Lab 08/08/17 0645  NA 138  K 4.2  CL 111  CO2 18*  BUN 36*  CREATININE 2.03*  CALCIUM 8.4*  PROT 6.8  BILITOT 0.7  ALKPHOS 88  ALT 191*  AST 294*  GLUCOSE 86   Lab Results  Component Value Date   CHOL 224 (H) 09/25/2014   HDL 42 09/25/2014   LDLCALC 147 (H) 09/25/2014   TRIG 177 (H) 09/25/2014  No results found for: [facility]   Radiology Studies    Dg Chest 2 View  Result Date: 08/08/2017 CLINICAL DATA:  Shortness of breath today. EXAM: CHEST  2 VIEW COMPARISON:  08/07/2017 FINDINGS: Shallow inspiration with linear atelectasis in the left lung base. Heart size and pulmonary vascularity are normal for technique. Tortuous aorta. Generator pack projected over the left mid chest. No blunting of costophrenic angles. No pneumothorax. Mediastinal contours appear intact. IMPRESSION: Shallow inspiration with linear atelectasis in the left lung base. Electronically Signed   By: Lucienne Capers M.D.   On: 08/08/2017 01:17   Dg Chest Portable 1 View  Result Date: 08/07/2017 CLINICAL DATA:  Patient with weakness. EXAM: PORTABLE CHEST 1 VIEW COMPARISON:  Chest radiograph 04/15/2016. FINDINGS: Monitoring leads overlie the patient. Stable cardiac and mediastinal contours. Tortuosity of the thoracic aorta. No consolidative pulmonary opacities. No pleural effusion or pneumothorax. IMPRESSION: No acute cardiopulmonary process. Electronically Signed   By: Lovey Newcomer M.D.   On: 08/07/2017 21:32    ECG & Cardiac Imaging    EKG, personally reviewed 11/26 - SR  Echo 11/05/2014: - Left ventricle: The cavity size was normal. There was mild concentric hypertrophy with  moderate focal basal septal hypertrophy. Systolic function was normal. The estimated ejection fraction was in the range of 60% to 65%. Wall motion was normal; there were no regional wall motion abnormalities. Doppler parameters are consistent with abnormal left ventricular relaxation (grade 1 diastolic dysfunction). - Mitral valve: There was mild regurgitation. - Pericardium, extracardiac: A trivial pericardial effusion was identified posterior to the heart.  Assessment & Plan    Elevated troponin: Started to downtrend this morning, peaked at 1.6. Likely in setting of demand ischemia with atrial flutter.  Atrial flutter: CHADS2VASC2 score of 5. Resolved on diltiazem drip; currently off of diltiazem and in sinus rhythm. Would not anticoagulate due to her overall deconditioning and high risk of falls. If her HR were to significantly increase, could consider adding low-dose metoprolol otherwise would hold off on starting anti-arrhythmics due to her lower HR and soft BPs.  HTN: Clonidine 0.1mg  TID and enalapril 20mg  daily at home. Currently only on Clonidine 0.05mg  BID; since admission other than one reading of 143 SBP, she has been normo or hypotensive.  HLD:  Pravastatin 40mg  daily  CKD stage 3: Baseline Cr 1.5-2; currently 2.0 (improved from 2.3 on admission).  Hypothyroidism: TSH elevated but patient not taking synthroid regularly at home due to odynophagia. --levothyroxine 155mcg daily  Anemia: Hgb 9.6 this AM from 11.4 on admission. Baseline is 11 (same as admission).   Odynophagia/dysphagia: Interferes with medication administration. Refer to primary for management.  Signed, Alphonzo Grieve, MD 08/08/2017, 11:15 AM   I have examined the patient and reviewed assessment and plan and discussed with patient.  Agree with above as stated.  She is at risk of falls.  She does not want to take COumadin and feels that blood thinners are too dangerous.  She currently  lives in a hotel but will be moving to [place] in a few weeks.  THen she plans to live in [place] where she has a regular cardiologist.    She has had bradycardia in the apst which precludes adding rate slowing drugs.  Currently back in NSR and feeling better.  I agree with her family member that she needs some assistance and it would be difficult for her to stay alone.   Elevated troponin, likely demand ischemia.  WOuld not pursue ischemia w/u.  No  chest pain at this time. Copntinue medical therapy.   Larae Grooms

## 2017-08-08 NOTE — Evaluation (Signed)
Occupational Therapy Evaluation Patient Details Name: Emily Campos MRN: [id number] DOB: 09-Jul-1926 Today's Date: 08/08/2017    History of Present Illness Pt is an 83 y.o. female who presented to the ED with extreme fatigue and weakness for 5 days with a cough. Found to have elevated troponin on arrival. PMH significant for CAD, HTN, HLD, hypothyroidism, OA, chronic low back pain, CHF (HFpEF), CKD III, and GERD.   Clinical Impression   PTA, pt reports living alone and completing basic ADL and functional mobility with modified independence with RW. 5 days PTA, pt reports becoming weak and being unable to stand from her chair. Pt currently requires max assist for stand-pivot toilet transfers, total assist for LB ADL, and min assist for UB ADL. She presents with B LE pain, R LE edema, decreased cognition, and generalized weakness impacting her safety and independence with ADL. Pt would benefit from continued OT services while admitted to improve independence and safety with ADL and functional mobility. Recommend SNF level rehabilitation post-acute D/C.     Follow Up Recommendations  Supervision/Assistance - 24 hour;SNF    Equipment Recommendations  Other (comment)(TBD at next venue of care)    Recommendations for Other Services       Precautions / Restrictions Precautions Precautions: Fall Restrictions Weight Bearing Restrictions: No      Mobility Bed Mobility Overal bed mobility: Needs Assistance Bed Mobility: Supine to Sit     Supine to sit: Max assist     General bed mobility comments: Max assist to manage B LE and raise trunk from [facility].   Transfers Overall transfer level: Needs assistance Equipment used: Rolling walker (2 wheeled) Transfers: Stand Pivot Transfers;Sit to/from Stand Sit to Stand: Max assist Stand pivot transfers: Max assist       General transfer comment: Max assist to power up into standing.     Balance Overall balance assessment: Needs  assistance Sitting-balance support: No upper extremity supported;Feet supported Sitting balance-Leahy Scale: Fair Sitting balance - Comments: Able to sit at EOB with supervision   Standing balance support: Bilateral upper extremity supported;No upper extremity supported Standing balance-Leahy Scale: Poor Standing balance comment: Relies on B UE support.                            ADL either performed or assessed with clinical judgement   ADL Overall ADL's : Needs assistance/impaired Eating/Feeding: Set up;Supervision/ safety;Sitting   Grooming: Supervision/safety;Sitting   Upper Body Bathing: Sitting;Minimal assistance   Lower Body Bathing: Total assistance;Sit to/from stand   Upper Body Dressing : Minimal assistance;Sitting   Lower Body Dressing: Total assistance;Sit to/from stand   Toilet Transfer: Maximal assistance;Stand-pivot;RW   Toileting- Clothing Manipulation and Hygiene: Maximal assistance;Sit to/from stand       Functional mobility during ADLs: Maximal assistance;Rolling walker(max assist for stand-pivot) General ADL Comments: Pt limited by weakness and BLE pain     Vision Patient Visual Report: No change from baseline Vision Assessment?: No apparent visual deficits     Perception     Praxis      Pertinent Vitals/Pain Pain Assessment: Faces Faces Pain Scale: Hurts even more Pain Location: B LE due to gout Pain Descriptors / Indicators: Aching;Sore Pain Intervention(s): Limited activity within patient's tolerance;Monitored during session;Repositioned     Hand Dominance     Extremity/Trunk Assessment Upper Extremity Assessment Upper Extremity Assessment: Generalized weakness   Lower Extremity Assessment Lower Extremity Assessment: Defer to PT evaluation(R LE edema noted)  Communication Communication Communication: Expressive difficulties   Cognition Arousal/Alertness: Awake/alert Behavior During Therapy: WFL for tasks  assessed/performed Overall Cognitive Status: No family/caregiver present to determine baseline cognitive functioning Area of Impairment: Attention;Memory;Following commands;Safety/judgement;Awareness;Problem solving                   Current Attention Level: Selective Memory: Decreased short-term memory Following Commands: Follows one step commands consistently;Follows multi-step commands inconsistently Safety/Judgement: Decreased awareness of safety;Decreased awareness of deficits Awareness: Intellectual Problem Solving: Slow processing;Requires verbal cues General Comments: Pt with poor understanding of deficits.    General Comments  Pt reports nausea. Notified RN.     Exercises     Shoulder Instructions      Home Living Family/patient expects to be discharged to:: Skilled nursing facility Living Arrangements: Alone Available Help at Discharge: Family;Available PRN/intermittently(grandson checks in on pt)               Bathroom Shower/Tub: (does not take showers)             Additional Comments: Need to further assess home set-up.       Prior Functioning/Environment Level of Independence: Needs assistance  Gait / Transfers Assistance Needed: Reports using RW for mobility. However, did not get out of chair for 5 days PTA.  ADL's / Homemaking Assistance Needed: Reports independence until 5 days ago. Yolanda Bonine comes in to check on pt and assists with IADL.             OT Problem List: Decreased strength;Decreased range of motion;Decreased activity tolerance;Impaired balance (sitting and/or standing);Decreased cognition;Decreased safety awareness;Decreased knowledge of use of DME or AE;Decreased knowledge of precautions;Cardiopulmonary status limiting activity;Pain      OT Treatment/Interventions: Self-care/ADL training;Therapeutic exercise;Energy conservation;DME and/or AE instruction;Therapeutic activities;Patient/family education;Balance training    OT  Goals(Current goals can be found in the care plan section) Acute Rehab OT Goals Patient Stated Goal: go home OT Goal Formulation: With patient Time For Goal Achievement: 08/22/17 Potential to Achieve Goals: Good  OT Frequency: Min 1X/week   Barriers to D/C:            Co-evaluation              AM-PAC PT "6 Clicks" Daily Activity     Outcome Measure Help from another person eating meals?: A Little Help from another person taking care of personal grooming?: A Little Help from another person toileting, which includes using toliet, bedpan, or urinal?: Total Help from another person bathing (including washing, rinsing, drying)?: Total Help from another person to put on and taking off regular upper body clothing?: A Little Help from another person to put on and taking off regular lower body clothing?: Total 6 Click Score: 12   End of Session Equipment Utilized During Treatment: Gait belt;Rolling walker Nurse Communication: Mobility status(found bug on sheets; pt nauseated)  Activity Tolerance: Patient tolerated treatment well Patient left: in chair;with call bell/phone within reach;with chair alarm set  OT Visit Diagnosis: Other abnormalities of gait and mobility (R26.89);Pain Pain - Right/Left: Right(bilateral) Pain - part of body: Leg(bilateral LE)                Time: 1001-1035 OT Time Calculation (min): 34 min Charges:  OT General Charges $OT Visit: 1 Visit OT Evaluation $OT Eval Moderate Complexity: 1 Mod OT Treatments $Self Care/Home Management : 8-22 mins G-Codes:     Norman Herrlich, MS OTR/L  Pager: [place] A Shareece Bultman 08/08/2017, 2:15 PM

## 2017-08-08 NOTE — Progress Notes (Signed)
Patient complains of leg pain. MD notified. Tylenol ordered and given to pt.

## 2017-08-08 NOTE — Progress Notes (Signed)
ANTICOAGULATION CONSULT NOTE   Pharmacy Consult for heparin Indication: atrial fibrillation  No Known Allergies  Patient Measurements: Height: 4\' 3"  (129.5 cm) Weight: 160 lb (72.6 kg) IBW/kg (Calculated) : 24.8 Heparin Dosing Weight: 45kg  Vital Signs: Temp: 98.1 F (36.7 C) (11/26 0219) Temp Source: Oral (11/26 0219) BP: 113/44 (11/26 0700) Pulse Rate: 59 (11/26 0700)  Labs: Recent Labs    08/07/17 1953 08/07/17 2023 08/08/17 0033 08/08/17 0645  HGB 11.4* 13.6  --  9.6*  HCT 34.5* 40.0  --  30.0*  PLT 256  --   --  272  HEPARINUNFRC  --   --   --  0.12*  CREATININE 2.27* 2.20*  --  2.03*  CKTOTAL 247*  --   --   --   TROPONINI  --   --  1.66* 1.23*    Estimated Creatinine Clearance: 13 mL/min (A) (by C-G formula based on SCr of 2.03 mg/dL (H)).   Medical History: Past Medical History:  Diagnosis Date  . Chronic pain syndrome 1997  . HTN (hypertension)   . Renal disorder   . Sickle cell trait ([place])   . Thyroid disease     Assessment: 83yo female c/o weakness and fluid overload, found to be in Aflutter, started on IV heparin infusion around midnight last night.  7 hour heparin level = 0.12 on heparin drip rate of 700 units/hr.  H/H has decreased Hgb 9.6, pltcl remains within normal limits. No bleeding noted. Troponin 0.59>1.12>1.66>1.23 AKI:  Scr 2.2 last night, down to 2.03 this AM.  baseline 1.5   Goal of Therapy:  Heparin level 0.3-0.7 units/ml Monitor platelets by anticoagulation protocol: Yes   Plan:  Give heparin 1000 units IV bolus x1 and increase heparin infusion rate to 800 units/hr.  6 hour heparin level.  Monitor heparin levels and CBC daily.  Nicole Cella, RPh Clinical Pharmacist Pager: [phone]-[phone] [place] [phone] 08/08/2017,8:28 AM

## 2017-08-08 NOTE — Progress Notes (Deleted)
ANTICOAGULATION CONSULT NOTE   Pharmacy Consult for heparin Indication: atrial fibrillation  No Known Allergies  Patient Measurements: Height: 4\' 3"  (129.5 cm) Weight: 160 lb (72.6 kg) IBW/kg (Calculated) : 24.8 Heparin Dosing Weight:   Vital Signs: Temp: 98.1 F (36.7 C) (11/26 1940) Temp Source: Oral (11/26 1940) BP: 156/68 (11/26 1940) Pulse Rate: 67 (11/26 1940)  Labs: Recent Labs    08/07/17 1953 08/07/17 2023 08/08/17 0033 08/08/17 0645 08/08/17 1832  HGB 11.4* 13.6  --  9.6*  --   HCT 34.5* 40.0  --  30.0*  --   PLT 256  --   --  272  --   HEPARINUNFRC  --   --   --  0.12* 0.29*  CREATININE 2.27* 2.20*  --  2.03*  --   CKTOTAL 247*  --   --   --   --   TROPONINI  --   --  1.66* 1.23* 1.04*    Estimated Creatinine Clearance: 13 mL/min (A) (by C-G formula based on SCr of 2.03 mg/dL (H)).   Medical History: Past Medical History:  Diagnosis Date  . Chronic pain syndrome 1997  . HTN (hypertension)   . Renal disorder   . Sickle cell trait ([place])   . Thyroid disease     Assessment: 83yo female c/o weakness and fluid overload, found to be in Aflutter, started on IV heparin infusion. -heparin level= 0.29 (increase after heparin bolus and infusion increase to 800 units/hr)  Goal of Therapy:  Heparin level 0.3-0.7 units/ml Monitor platelets by anticoagulation protocol: Yes   Plan:  -Increase heparin to 850 units/hr -Recheck heparin level with CBC in am  Hildred Laser, Pharm D 08/08/2017 8:05 PM

## 2017-08-08 NOTE — ED Notes (Signed)
Writer notified PA Oatman about I-stat trop result.

## 2017-08-09 ENCOUNTER — Other Ambulatory Visit: Payer: Self-pay

## 2017-08-09 ENCOUNTER — Observation Stay (HOSPITAL_BASED_OUTPATIENT_CLINIC_OR_DEPARTMENT_OTHER): Payer: Medicare Other

## 2017-08-09 DIAGNOSIS — M79609 Pain in unspecified limb: Secondary | ICD-10-CM

## 2017-08-09 DIAGNOSIS — I483 Typical atrial flutter: Secondary | ICD-10-CM | POA: Diagnosis not present

## 2017-08-09 DIAGNOSIS — R748 Abnormal levels of other serum enzymes: Secondary | ICD-10-CM | POA: Diagnosis not present

## 2017-08-09 DIAGNOSIS — R7989 Other specified abnormal findings of blood chemistry: Secondary | ICD-10-CM

## 2017-08-09 DIAGNOSIS — R778 Other specified abnormalities of plasma proteins: Secondary | ICD-10-CM

## 2017-08-09 LAB — HEPARIN LEVEL (UNFRACTIONATED): Heparin Unfractionated: 0.32 IU/mL (ref 0.30–0.70)

## 2017-08-09 LAB — COMPREHENSIVE METABOLIC PANEL
ALT: 117 U/L — ABNORMAL HIGH (ref 14–54)
ANION GAP: 8 (ref 5–15)
AST: 107 U/L — ABNORMAL HIGH (ref 15–41)
Albumin: 2.3 g/dL — ABNORMAL LOW (ref 3.5–5.0)
Alkaline Phosphatase: 77 U/L (ref 38–126)
BUN: 33 mg/dL — ABNORMAL HIGH (ref 6–20)
CHLORIDE: 112 mmol/L — AB (ref 101–111)
CO2: 18 mmol/L — AB (ref 22–32)
Calcium: 8.2 mg/dL — ABNORMAL LOW (ref 8.9–10.3)
Creatinine, Ser: 1.95 mg/dL — ABNORMAL HIGH (ref 0.44–1.00)
GFR calc non Af Amer: 22 mL/min — ABNORMAL LOW (ref >60)
GFR, EST AFRICAN AMERICAN: 25 mL/min — AB (ref >60)
Glucose, Bld: 80 mg/dL (ref 65–99)
POTASSIUM: 4 mmol/L (ref 3.5–5.1)
SODIUM: 138 mmol/L (ref 135–145)
Total Bilirubin: 0.5 mg/dL (ref 0.3–1.2)
Total Protein: 6.2 g/dL — ABNORMAL LOW (ref 6.5–8.1)

## 2017-08-09 LAB — HEMOGLOBIN A1C
Hgb A1c MFr Bld: 4.7 % — ABNORMAL LOW (ref 4.8–5.6)
MEAN PLASMA GLUCOSE: 88 mg/dL

## 2017-08-09 LAB — URINE CULTURE: CULTURE: NO GROWTH

## 2017-08-09 LAB — CBC
HCT: 29.7 % — ABNORMAL LOW (ref 36.0–46.0)
Hemoglobin: 9.4 g/dL — ABNORMAL LOW (ref 12.0–15.0)
MCH: 31 pg (ref 26.0–34.0)
MCHC: 31.6 g/dL (ref 30.0–36.0)
MCV: 98 fL (ref 78.0–100.0)
Platelets: 306 K/uL (ref 150–400)
RBC: 3.03 MIL/uL — ABNORMAL LOW (ref 3.87–5.11)
RDW: 13.3 % (ref 11.5–15.5)
WBC: 10.6 K/uL — ABNORMAL HIGH (ref 4.0–10.5)

## 2017-08-09 MED ORDER — CLONIDINE HCL 0.1 MG PO TABS: 0.0500 mg | ORAL_TABLET | Freq: Two times a day (BID) | ORAL | 0 refills | Status: AC

## 2017-08-09 MED ORDER — METOPROLOL TARTRATE 12.5 MG HALF TABLET
12.5000 mg | ORAL_TABLET | Freq: Two times a day (BID) | ORAL | Status: DC
Start: 2017-08-09 — End: 2017-08-09
  Administered 2017-08-09: 12.5 mg via ORAL
  Filled 2017-08-09: qty 1

## 2017-08-09 MED ORDER — ENOXAPARIN SODIUM 30 MG/0.3ML ~~LOC~~ SOLN
30.0000 mg | SUBCUTANEOUS | Status: DC
  Administered 2017-08-09: 30 mg via SUBCUTANEOUS
  Filled 2017-08-09: qty 0.3

## 2017-08-09 MED ORDER — METOPROLOL TARTRATE 25 MG PO TABS: 12.5000 mg | ORAL_TABLET | Freq: Two times a day (BID) | ORAL | 0 refills | Status: AC

## 2017-08-09 MED ORDER — GABAPENTIN 100 MG PO CAPS
100.0000 mg | ORAL_CAPSULE | Freq: Three times a day (TID) | ORAL | Status: DC
  Administered 2017-08-09 (×2): 100 mg via ORAL
  Filled 2017-08-09 (×2): qty 1

## 2017-08-09 NOTE — Progress Notes (Signed)
VASCULAR LAB PRELIMINARY  ARTERIAL  ABI completed:    RIGHT    LEFT    PRESSURE WAVEFORM  PRESSURE WAVEFORM  BRACHIAL 140 Bi BRACHIAL 137 Bi  DP 146 Mono DP 146 Mono  PT 121 Mono PT 147 Mono  GREAT TOE 106 NA GREAT TOE 122 NA    RIGHT LEFT  ABI 1.05 1.04     Gianlucas Evenson C Baer [place], RVT 08/09/2017, 3:00 PM

## 2017-08-09 NOTE — Clinical Social Work Placement (Signed)
   CLINICAL SOCIAL WORK PLACEMENT  NOTE  Date:  08/09/2017  Patient Details  Name: Emily Campos MRN: [id number] Date of Birth: 1926/04/18  Clinical Social Work is seeking post-discharge placement for this patient at the [place] level of care (*CSW will initial, date and re-position this form in  chart as items are completed):  Yes   Patient/family provided with [facility] Work Department's list of facilities offering this level of care within the geographic area requested by the patient (or if unable, by the patient's family).  Yes   Patient/family informed of their freedom to choose among providers that offer the needed level of care, that participate in Medicare, Medicaid or managed care program needed by the patient, have an available bed and are willing to accept the patient.  Yes   Patient/family informed of [facility]'s ownership interest in [facility] and [facility], as well as of the fact that they are under no obligation to receive care at these facilities.  PASRR submitted to EDS on 08/09/17     PASRR number received on 08/09/17     Existing PASRR number confirmed on       FL2 transmitted to all facilities in geographic area requested by pt/family on 08/09/17     FL2 transmitted to all facilities within larger geographic area on       Patient informed that his/her managed care company has contracts with or will negotiate with certain facilities, including the following:            Patient/family informed of bed offers received.  Patient chooses bed at       Physician recommends and patient chooses bed at      Patient to be transferred to   on  .  Patient to be transferred to facility by       Patient family notified on   of transfer.  Name of family member notified:        PHYSICIAN Please sign FL2     Additional Comment:    _______________________________________________ Candie Chroman,  LCSW 08/09/2017, 11:41 AM

## 2017-08-09 NOTE — NC FL2 (Signed)
[place] MEDICAID FL2 LEVEL OF CARE SCREENING TOOL     IDENTIFICATION  Patient Name: Emily Campos Birthdate: 1926/04/10 Sex: female Admission Date (Current Location): 08/07/2017  [facility] and [place] Number:  Herbalist and Address:  [facility]. [facility], [place] [address], [place], [place] [address]      Provider Number: [id number]  Attending Physician Name and Address:  Lind Covert, MD  Relative Name and Phone Number:       Current Level of Care: Hospital Recommended Level of Care: [place] Prior Approval Number:    Date Approved/Denied:   PASRR Number: [id number]  Discharge Plan: SNF    Current Diagnoses: Patient Active Problem List   Diagnosis Date Noted  . ACS (acute coronary syndrome) ([place]) 08/08/2017  . Typical atrial flutter ([place])   . Cough 06/10/2017  . Bilateral leg and foot pain 06/09/2017  . Chronic venous insufficiency 05/04/2017  . Sore throat 10/05/2016  . GERD (gastroesophageal reflux disease) 10/05/2016  . Edema of right lower extremity 04/27/2016  . Chronic rhinosinusitis 09/17/2015  . Chronic pain syndrome 09/17/2015  . Hypertensive heart disease 05/22/2015  . Hypertensive kidney and heart disease without congestive heart failure, stage III ([place]) 05/22/2015  . Hyperlipidemia 03/19/2015  . CKD (chronic kidney disease) stage 3, GFR 30-59 ml/min (HCC) 09/26/2014  . HTN (hypertension) 09/25/2014  . Hypothyroidism 09/25/2014  . Chronic back pain 09/25/2014  . Osteoarthritis of both knees 09/25/2014  . Chronic diastolic CHF (congestive heart failure), NYHA class 2 ([place]) 09/25/2014    Orientation RESPIRATION BLADDER Height & Weight     Self, Time, Situation, Place  Normal Incontinent, External catheter Weight: 149 lb 11.1 oz (67.9 kg) Height:  4\' 3"  (129.5 cm)  BEHAVIORAL SYMPTOMS/MOOD NEUROLOGICAL BOWEL NUTRITION STATUS  (None) (None) Continent Diet(Regular)  AMBULATORY STATUS  COMMUNICATION OF NEEDS Skin   Extensive Assist Verbally Normal                       Personal Care Assistance Level of Assistance  Bathing, Feeding, Dressing Bathing Assistance: Maximum assistance Feeding assistance: Limited assistance Dressing Assistance: Maximum assistance     Functional Limitations Info  Sight, Hearing, Speech Sight Info: Adequate Hearing Info: Adequate Speech Info: Adequate    SPECIAL CARE FACTORS FREQUENCY  PT (By licensed PT), OT (By licensed OT)     PT Frequency: 5 x week OT Frequency: 5 x week            Contractures Contractures Info: Not present    Additional Factors Info  Code Status, Allergies, Isolation Precautions Code Status Info: Full Allergies Info: NKDA     Isolation Precautions Info: Not on isolation precautions but does have bed bugs.     Current Medications (08/09/2017):  This is the current hospital active medication list Current Facility-Administered Medications  Medication Dose Route Frequency Provider Last Rate Last Dose  . acetaminophen (TYLENOL) tablet 650 mg  650 mg Oral Q6H PRN Rogue Bussing, MD   650 mg at 08/09/17 0657  . cloNIDine (CATAPRES) tablet 0.05 mg  0.05 mg Oral BID Rory Percy, DO   0.05 mg at 08/09/17 0811  . enoxaparin (LOVENOX) injection 30 mg  30 mg Subcutaneous Q24H Olene Floss [place], MD   30 mg at 08/09/17 1110  . gabapentin (NEURONTIN) capsule 100 mg  100 mg Oral TID Rogue Bussing, MD   100 mg at 08/09/17 1105  . levothyroxine (SYNTHROID, LEVOTHROID) tablet 100 mcg  100 mcg Oral QAC breakfast Tonette Bihari, MD   100 mcg at 08/09/17 0811  . pantoprazole (PROTONIX) EC tablet 40 mg  40 mg Oral Daily Eloise Levels, MD   40 mg at 08/09/17 5361     Discharge Medications: Please see discharge summary for a list of discharge medications.  Relevant Imaging Results:  Relevant Lab Results:   Additional Information SS#: [id number]. Has bed bugs. Aware that  she cannot bring any personal belongings. Plan is to move to [facility] with her granddaughter once released from SNF. Confirmed with granddaughter.  Candie Chroman, LCSW

## 2017-08-09 NOTE — Clinical Social Work Placement (Signed)
   CLINICAL SOCIAL WORK PLACEMENT  NOTE  Date:  08/09/2017  Patient Details  Name: Emily Campos MRN: [id number] Date of Birth: Jul 30, 1926  Clinical Social Work is seeking post-discharge placement for this patient at the [place] level of care (*CSW will initial, date and re-position this form in  chart as items are completed):  Yes   Patient/family provided with [place] Work Department's list of facilities offering this level of care within the geographic area requested by the patient (or if unable, by the patient's family).  Yes   Patient/family informed of their freedom to choose among providers that offer the needed level of care, that participate in Medicare, Medicaid or managed care program needed by the patient, have an available bed and are willing to accept the patient.  Yes   Patient/family informed of [facility]'s ownership interest in [facility] and [facility], as well as of the fact that they are under no obligation to receive care at these facilities.  PASRR submitted to EDS on 08/09/17     PASRR number received on 08/09/17     Existing PASRR number confirmed on       FL2 transmitted to all facilities in geographic area requested by pt/family on 08/09/17     FL2 transmitted to all facilities within larger geographic area on       Patient informed that his/her managed care company has contracts with or will negotiate with certain facilities, including the following:        Yes   Patient/family informed of bed offers received.  Patient chooses bed at [facility]     Physician recommends and patient chooses bed at      Patient to be transferred to [facility] on 08/09/17.  Patient to be transferred to facility by Hollie Salk     Patient family notified on 08/09/17 of transfer.  Name of family member notified:  [facility]     PHYSICIAN Please prepare prescriptions      Additional Comment:    _______________________________________________ Candie Chroman, LCSW 08/09/2017, 3:51 PM

## 2017-08-09 NOTE — Progress Notes (Signed)
Responded to spiritual care consult to provide care.  Patient is with staff doing a procedure.  AD form left with patient so that she can discuss with grand-daughter. Pt. Possible discharge today. Prayer made . Chaplain available as needed.  Jaclynn Major, [place], [facility], Pager [phone]

## 2017-08-09 NOTE — Evaluation (Signed)
Physical Therapy Evaluation Patient Details Name: Emily Campos MRN: [id number] DOB: [birth year] Today's Date: 08/09/2017   History of Present Illness  Pt is an 83 y.o. female who presented to the ED with extreme fatigue and weakness for 5 days with a cough. Found to have elevated troponin on arrival. PMH significant for CAD, HTN, HLD, hypothyroidism, OA, chronic low back pain, CHF (HFpEF), CKD III, and GERD.    Clinical Impression  Pt presents with an overall decrease in functional mobility secondary to above. PTA, pt lives alone and reports mod indep with mobility; plans to d/c to SNF and then move to [place] with granddaughter. Today, pt required maxA for bed mobility and stand pivoting to chair; further mobility limited secondary to c/o BLE pain with all movement. Pt would benefit from continued acute PT services to maximize functional mobility and independence prior to d/c with SNF-level therapies.     Follow Up Recommendations SNF;Supervision/Assistance - 24 hour    Equipment Recommendations  Other (comment)(TBD)    Recommendations for Other Services       Precautions / Restrictions Precautions Precautions: Fall Restrictions Weight Bearing Restrictions: No      Mobility  Bed Mobility Overal bed mobility: Needs Assistance Bed Mobility: Supine to Sit     Supine to sit: Max assist     General bed mobility comments: MaxA to assist RLE to EOB and scoot hips to EOB  Transfers Overall transfer level: Needs assistance Equipment used: None Transfers: Stand Pivot Transfers;Sit to/from Stand Sit to Stand: Max assist Stand pivot transfers: Max assist       General transfer comment: Max assist to power up into standing. Pt providing very limited assist secondary to c/o BLE pain, but able to hold onto therapist with BUEs  Ambulation/Gait                Stairs            Wheelchair Mobility    Modified Rankin (Stroke Patients Only)       Balance  Overall balance assessment: Needs assistance Sitting-balance support: No upper extremity supported;Feet supported Sitting balance-Leahy Scale: Fair Sitting balance - Comments: Able to sit at EOB with supervision; significant forward flexed posture   Standing balance support: Bilateral upper extremity supported Standing balance-Leahy Scale: Poor                               Pertinent Vitals/Pain Pain Assessment: Faces Faces Pain Scale: Hurts even more Pain Location: B LE (R>L) due to gout Pain Descriptors / Indicators: Aching;Sore Pain Intervention(s): Monitored during session;Limited activity within patient's tolerance    Home Living Family/patient expects to be discharged to:: Skilled nursing facility Living Arrangements: Alone Available Help at Discharge: Family;Available PRN/intermittently             Additional Comments: Plans to d/c to SNF, then move to [place] with granddaughter    Prior Function Level of Independence: Needs assistance   Gait / Transfers Assistance Needed: Reports using RW for mobility. However, did not get out of chair for 5 days PTA secondary to increased gout pain, nausea, and stomach cramps.   ADL's / Homemaking Assistance Needed: Reports independence until 5 days ago. Yolanda Bonine comes in to check on pt and assists with IADL.         Hand Dominance        Extremity/Trunk Assessment   Upper Extremity Assessment Upper Extremity Assessment: Generalized weakness  Lower Extremity Assessment Lower Extremity Assessment: Generalized weakness;RLE deficits/detail RLE Deficits / Details: RLE edema (pt reports it has looked that way for 2 years) RLE: Unable to fully assess due to pain    Cervical / Trunk Assessment Cervical / Trunk Assessment: Kyphotic  Communication   Communication: Expressive difficulties  Cognition Arousal/Alertness: Awake/alert Behavior During Therapy: WFL for tasks assessed/performed Overall Cognitive  Status: No family/caregiver present to determine baseline cognitive functioning Area of Impairment: Attention;Following commands;Safety/judgement;Awareness;Problem solving                   Current Attention Level: Selective   Following Commands: Follows one step commands consistently;Follows multi-step commands inconsistently Safety/Judgement: Decreased awareness of safety;Decreased awareness of deficits Awareness: Emergent Problem Solving: Slow processing;Requires verbal cues General Comments: Pt with poor understanding of deficits.       General Comments      Exercises     Assessment/Plan    PT Assessment Patient needs continued PT services  PT Problem List Decreased strength;Decreased activity tolerance;Decreased balance;Decreased mobility;Decreased cognition;Decreased knowledge of use of DME;Decreased knowledge of precautions;Decreased safety awareness;Pain;Decreased skin integrity       PT Treatment Interventions DME instruction;Gait training;Functional mobility training;Therapeutic activities;Balance training;Wheelchair mobility training;Therapeutic exercise;Patient/family education    PT Goals (Current goals can be found in the Care Plan section)  Acute Rehab PT Goals Patient Stated Goal: Get more rehab at SNF before moving with granddaughter PT Goal Formulation: With patient Time For Goal Achievement: 08/23/17 Potential to Achieve Goals: Good    Frequency Min 2X/week   Barriers to discharge Inaccessible home environment;Decreased caregiver support      Co-evaluation               AM-PAC PT "6 Clicks" Daily Activity  Outcome Measure Difficulty turning over in bed (including adjusting bedclothes, sheets and blankets)?: Unable Difficulty moving from lying on back to sitting on the side of the bed? : Unable Difficulty sitting down on and standing up from a chair with arms (e.g., wheelchair, bedside commode, etc,.)?: Unable Help needed moving to and  from a bed to chair (including a wheelchair)?: A Lot Help needed walking in hospital room?: Total Help needed climbing 3-5 steps with a railing? : Total 6 Click Score: 7    End of Session Equipment Utilized During Treatment: Gait belt Activity Tolerance: Patient limited by pain Patient left: in chair;with call bell/phone within reach;with chair alarm set Nurse Communication: Mobility status;Need for lift equipment PT Visit Diagnosis: Other abnormalities of gait and mobility (R26.89);Pain Pain - Right/Left: (R>L) Pain - part of body: Leg;Ankle and joints of foot    Time: 8119-1478 PT Time Calculation (min) (ACUTE ONLY): 25 min   Charges:   PT Evaluation $PT Eval Moderate Complexity: 1 Mod PT Treatments $Therapeutic Activity: 8-22 mins   PT G Codes:   PT G-Codes **NOT FOR INPATIENT CLASS** Functional Assessment Tool Used: AM-PAC 6 Clicks Basic Mobility Functional Limitation: Mobility: Walking and moving around Mobility: Walking and Moving Around Current Status (G9562): At least 80 percent but less than 100 percent impaired, limited or restricted Mobility: Walking and Moving Around Goal Status [phone]): At least 1 percent but less than 20 percent impaired, limited or restricted   Mabeline Caras, PT, DPT Acute Rehab Services  Pager: [phone] 08/09/2017, 8:53 AM

## 2017-08-09 NOTE — Progress Notes (Signed)
Provided pt with advanced directive paperwork. Answered her questions.

## 2017-08-09 NOTE — Clinical Social Work Note (Signed)
CSW facilitated patient discharge including contacting patient family and facility to confirm patient discharge plans. Clinical information faxed to facility and family agreeable with plan. CSW arranged ambulance transport via [facility] to [facility]. RN to call report prior to discharge [phone] Room 162B).  CSW will sign off for now as social work intervention is no longer needed. Please consult [place] again if new needs arise.  Dayton Scrape, [place]

## 2017-08-09 NOTE — Clinical Social Work Note (Addendum)
Bed offers provided. Patient chose [facility]. Admissions coordinator notified but did not see information regarding bed bugs on referral. She will have to call her administrator to see if they can accept her or not.  Dayton Scrape, [place] [phone]  2:19 pm [facility] retracted their bed offer. Patient notified. Her next choice is [facility]. Hospital liaison notified. She is already aware of the bed bugs. She will review chart again and confirm plan with CSW.  Dayton Scrape, [place] [phone]  3:01 pm [facility] can take patient today. CSW paged MD.  Dayton Scrape, [place]

## 2017-08-09 NOTE — Care Management Obs Status (Signed)
[facility] NOTIFICATION   Patient Details  Name: Emily Campos MRN: [id number] Date of Birth: 1926/09/10   Medicare Observation Status Notification Given:  Yes    Carles Collet, RN 08/09/2017, 10:09 AM

## 2017-08-09 NOTE — Clinical Social Work Note (Signed)
Clinical Social Work Assessment  Patient Details  Name: Emily Campos MRN: [id number] Date of Birth: 03-30-1926  Date of referral:  08/09/17               Reason for consult:  Facility Placement, Discharge Planning                Permission sought to share information with:  Facility Sport and exercise psychologist, Family Supports Permission granted to share information::  Yes, Verbal Permission Granted  Name::     Emily Campos  Agency::  SNF's  Relationship::  Granddaughter  Contact Information:  [phone]  Housing/Transportation Living arrangements for the past 2 months:  Apartment Source of Information:  Patient, Medical Team, Other (Comment Required)(Grandchildren) Patient Interpreter Needed:  None Criminal Activity/Legal Involvement Pertinent to Current Situation/Hospitalization:  No - Comment as needed Significant Relationships:  Other(Comment)(Grandchildren) Lives with:  Self Do you feel safe going back to the place where you live?  No Need for family participation in patient care:  Yes (Comment)  Care giving concerns:  PT recommending SNF once medically stable for discharge.   Social Worker assessment / plan:  CSW met with patient. No supports at bedside. CSW introduced role and explained that PT recommendations would be discussed. Patient is agreeable to SNF. She is not familiar with local facilities. CSW provided SNF list for review. Patient confirmed that she plans to move to [facility] with her granddaughter once discharged from SNF. She gave CSW permission to call her granddaughter. She is agreeable to SNF placement as well although she would prefer for it to be in [place]. No further concerns. CSW encouraged patient and her granddaughter to contact CSW as needed. CSW will continue to follow patient and her granddaughter for support and facilitate discharge to SNF today. Patient and granddaughter aware of plan for discharge today.  Employment status:  Retired Designer, industrial/product PT Recommendations:  [place] / Referral to community resources:  [place]  Patient/Family's Response to care:  Patient and her granddaughter agreeable to SNF placement. Patient's grandchildren supportive and involved in patient's care. Patient and her granddaughter appreciated social work intervention.  Patient/Family's Understanding of and Emotional Response to Diagnosis, Current Treatment, and Prognosis:  Patient and his granddaughter have a good understanding of the reason for admission and her need for rehab prior to returning home. Patient and her granddaughter appear happy with hospital care.  Emotional Assessment Appearance:  Appears stated age Attitude/Demeanor/Rapport:  Other(Pleasant) Affect (typically observed):  Accepting, Appropriate, Calm, Pleasant Orientation:  Oriented to Self, Oriented to Place, Oriented to  Time, Oriented to Situation Alcohol / Substance use:  Never Used Psych involvement (Current and /or in the community):  No (Comment)  Discharge Needs  Concerns to be addressed:  Care Coordination Readmission within the last 30 days:  No Current discharge risk:  Dependent with Mobility, Lives alone Barriers to Discharge:  No Barriers Identified   Candie Chroman, LCSW 08/09/2017, 11:34 AM

## 2017-08-09 NOTE — Care Management CC44 (Signed)
Condition Code 44 Documentation Completed  Patient Details  Name: Emily Campos MRN: [id number] Date of Birth: [birth year]   Condition Code 44 given:  Yes Patient signature on Condition Code 44 notice:  Yes Documentation of 2 MD's agreement:  Yes Code 44 added to claim:  Yes    Carles Collet, RN 08/09/2017, 10:09 AM

## 2017-08-09 NOTE — Progress Notes (Signed)
Family Medicine Teaching Service Daily Progress Note Intern Pager: [phone]  Patient name: Emily Campos Medical record number: [id number] Date of birth: 1926/01/14 Age: 83 y.o. Gender: female  Primary Care Provider: Verner Mould, MD Consultants: Cardiology Code Status: Full  Pt Overview and Major Events to Date:  11/26 - admitted for fatigue, ACS r/o  Assessment and Plan: Emily Campos is a 83 y.o. female presenting with extreme fatigue for 5 days with a cough. PMH is significant for CAD, HTN, HLD, Hypothyroidism, OA, Chronic low back pain, CHF (HFpEF), CKD III, and GERD.  Fatigue, ACS r/o: Patient had atrial flutter on presentation to the ED that has resolved that could have been contributing to her symptoms of fatigue. istat Trop 0.59>1.12, trop 1.66>1.04. CK 247. Repeat EKG in ED without ST elevation or depression once arrhythmia controlled. Patient had elevated Lactic Acid 5.1>1.43 with fluids and WBC 14.4>12.7>10.6 which both trended down with fluids. TSH 11.842 11/26. Continues to be afebrile with no suspected source of infection, blood cultures NGx24hrs, urine cultures pending. CXR negative for any acute process. Fatigue most likely brought on by atrial flutter that resolved in ED on dilt drip. Has been in NSR off of diltazem for >24 hours. Elevated trop likely due to demand ischemia. ACS r/o complete and no further ischemic work up needed per Cards. OT recommended SNF. Afterwards will live with granddaughter in [place]. - Cardiology consulted, appreciate recs - ankle-brachial index to investigate right lower leg edema, scheduled but doesn't need to be done prior to d/c - PT  Atrial Flutter, resolved: Atrial Flutter has resolved with dilt drip, has been in NSR off of diltazem >24 hours. Patient is on heparin per cards. Likely cause of fatigue, now also resolved. Not showing signs of CHF exacerbation; continue to monitor. Risk stratification labs: HbA1c 4.7,TSH  11.842, lipid panel pending. - Cardiology consulted - ACS r/o complete - On Heparin per pharmacy  AKI, improving Cr 2.27>1.95, baseline ~1.5-1.9. Patient states she "almost had to have her kidneys on a machine when she was at home" in [place].  Right Lower extremity Edema: Chronic in nature. Has been evaluated twice for blood clot in August and June both were negative.  Patient notes generalized pain in R leg this am that seems to be chronic. Per Dr. Avon Gully has seen patient in the past, thought foot pain was caused by neuropathy. Does not c/o tingling on exam this am. Uric acid 8.3, not likely gout flare due to no erythema, warmth. Palpable pulses. - Will obtain an ABI, although doesn't need to be done prior to d/c  Slightly Elevated Liver function: AST 360/ALT262.  - Will check hepatitis panel, does not have to be resulted prior to d/c  - Continue to follow   Hypothyroidism: Patient has had hypothyroidism and been well controlled in the past. TSH 11.842 this am. Of note, patient has not been consistent with meds the past week. Lives alone and questionable how reliable/independent she is at home. Lives in a hotel. - Continue Levothyroxine 157mcg, will need labs redrawn after remains on current dose for a few more weeks.  HTN: BP since admission has been reasonably well controlled. Continue to monitor. On Clonidine 0.1mg  TID at home. - Clonidine 0.05mg  BID  HLD: Last lipid panel was 2016 and was significant for elevated cholesterol and LDL. - Continue home pravastatin 40mg  - Lipid panel   FEN/GI: full diet Prophylaxis: Heparin  Disposition: stable for d/c to SNF today pending placement  Subjective:  Patient  feels ok today, c/o R leg pain. Currently working with PT.  Objective: Temp:  [98 F (36.7 C)-98.3 F (36.8 C)] 98.3 F (36.8 C) (11/27 0628) Pulse Rate:  [67-77] 77 (11/27 0809) Resp:  [16-18] 18 (11/27 0628) BP: (118-156)/(60-83) 130/63 (11/27 0809) SpO2:  [91  %-100 %] 99 % (11/27 0809) Weight:  [149 lb 11.1 oz (67.9 kg)] 149 lb 11.1 oz (67.9 kg) (11/27 1275)  Physical Exam: General: elderly female sitting in bed, in NAD Cardiovascular: RRR, no murmurs/gallops Respiratory: CTA bilaterally, no wheezes/crackles  Abdomen: soft, NTND, +BS, ventral hernia present and soft and somewhat reducible Extremities: R leg with nonpitting edema, DP pulses appreciated, warm  Laboratory: Recent Labs  Lab 08/07/17 1953 08/07/17 2023 08/08/17 0645 08/09/17 0322  WBC 14.4*  --  12.7* 10.6*  HGB 11.4* 13.6 9.6* 9.4*  HCT 34.5* 40.0 30.0* 29.7*  PLT 256  --  272 306   Recent Labs  Lab 08/07/17 1953 08/07/17 2023 08/08/17 0645 08/09/17 0322  NA 132* 136 138 138  K 4.0 4.9 4.2 4.0  CL 104 113* 111 112*  CO2 17*  --  18* 18*  BUN 34* 38* 36* 33*  CREATININE 2.27* 2.20* 2.03* 1.95*  CALCIUM 8.5*  --  8.4* 8.2*  PROT 7.5  --  6.8 6.2*  BILITOT 0.6  --  0.7 0.5  ALKPHOS 91  --  88 77  ALT 262*  --  191* 117*  AST 360*  --  294* 107*  GLUCOSE 146* 141* 86 80   11/26 EKG - sinus bradycardia  Imaging/Diagnostic Tests: No results found.  Rory Percy, DO 08/09/2017, 9:09 AM PGY-1, [address] Intern pager: [phone], text pages welcome

## 2017-08-09 NOTE — Progress Notes (Signed)
Progress Note  Patient Name: Emily Campos Date of Encounter: 08/09/2017  Primary Cardiologist: Dr. Irish Lack  Subjective   Patient states she feels better today. She denies chest pain, shortness of breath, palpitations. Her heart burn is a lot better today.  Inpatient Medications    Scheduled Meds: . cloNIDine  0.05 mg Oral BID  . levothyroxine  100 mcg Oral QAC breakfast  . pantoprazole  40 mg Oral Daily  . pravastatin  40 mg Oral QHS   Continuous Infusions: . heparin 850 Units/hr (08/09/17 0241)   PRN Meds: acetaminophen   Vital Signs    Vitals:   08/08/17 1940 08/09/17 0024 08/09/17 0628 08/09/17 0809  BP: (!) 156/68 118/63 (!) 153/83 130/63  Pulse: 67 70 77 77  Resp: 18 18 18    Temp: 98.1 F (36.7 C) 98 F (36.7 C) 98.3 F (36.8 C)   TempSrc: Oral Oral Oral   SpO2: 91% 100% 100% 99%  Weight:   149 lb 11.1 oz (67.9 kg)   Height:        Intake/Output Summary (Last 24 hours) at 08/09/2017 0916 Last data filed at 08/09/2017 0705 Gross per 24 hour  Intake 488.67 ml  Output 900 ml  Net -411.33 ml   Filed Weights   08/07/17 2200 08/09/17 0628  Weight: 160 lb (72.6 kg) 149 lb 11.1 oz (67.9 kg)    Telemetry    SR - Personally Reviewed  ECG    N/a today.  Physical Exam   GEN: No acute distress.   Neck: No JVD Cardiac: RRR, no murmurs, rubs, or gallops.  Respiratory: Clear to auscultation bilaterally. GI: Soft, nontender, non-distended  MS: R leg pitting edema, with generalized tenderness. Neuro:  Nonfocal  Psych: Normal affect   Labs    Chemistry Recent Labs  Lab 08/07/17 1953 08/07/17 2023 08/08/17 0645 08/09/17 0322  NA 132* 136 138 138  K 4.0 4.9 4.2 4.0  CL 104 113* 111 112*  CO2 17*  --  18* 18*  GLUCOSE 146* 141* 86 80  BUN 34* 38* 36* 33*  CREATININE 2.27* 2.20* 2.03* 1.95*  CALCIUM 8.5*  --  8.4* 8.2*  PROT 7.5  --  6.8 6.2*  ALBUMIN 2.8*  --  2.7* 2.3*  AST 360*  --  294* 107*  ALT 262*  --  191* 117*  ALKPHOS 91   --  88 77  BILITOT 0.6  --  0.7 0.5  GFRNONAA 18*  --  21* 22*  GFRAA 21*  --  24* 25*  ANIONGAP 11  --  9 8     Hematology Recent Labs  Lab 08/07/17 1953 08/07/17 2023 08/08/17 0645 08/09/17 0322  WBC 14.4*  --  12.7* 10.6*  RBC 3.59*  --  3.08* 3.03*  HGB 11.4* 13.6 9.6* 9.4*  HCT 34.5* 40.0 30.0* 29.7*  MCV 96.1  --  97.4 98.0  MCH 31.8  --  31.2 31.0  MCHC 33.0  --  32.0 31.6  RDW 12.7  --  13.2 13.3  PLT 256  --  272 306    Cardiac Enzymes Recent Labs  Lab 08/08/17 0033 08/08/17 0645 08/08/17 1832  TROPONINI 1.66* 1.23* 1.04*    Recent Labs  Lab 08/07/17 2023 08/07/17 2350  TROPIPOC 0.59* 1.12*     BNPNo results for input(s): BNP, PROBNP in the last 168 hours.   DDimer No results for input(s): DDIMER in the last 168 hours.   Radiology    Dg Chest  2 View  Result Date: 08/08/2017 CLINICAL DATA:  Shortness of breath today. EXAM: CHEST  2 VIEW COMPARISON:  08/07/2017 FINDINGS: Shallow inspiration with linear atelectasis in the left lung base. Heart size and pulmonary vascularity are normal for technique. Tortuous aorta. Generator pack projected over the left mid chest. No blunting of costophrenic angles. No pneumothorax. Mediastinal contours appear intact. IMPRESSION: Shallow inspiration with linear atelectasis in the left lung base. Electronically Signed   By: Lucienne Capers M.D.   On: 08/08/2017 01:17   Dg Chest Portable 1 View  Result Date: 08/07/2017 CLINICAL DATA:  Patient with weakness. EXAM: PORTABLE CHEST 1 VIEW COMPARISON:  Chest radiograph 04/15/2016. FINDINGS: Monitoring leads overlie the patient. Stable cardiac and mediastinal contours. Tortuosity of the thoracic aorta. No consolidative pulmonary opacities. No pleural effusion or pneumothorax. IMPRESSION: No acute cardiopulmonary process. Electronically Signed   By: Lovey Newcomer M.D.   On: 08/07/2017 21:32    Cardiac Studies   Echo 11/05/2014: - Left ventricle: The cavity size was normal.  There was mild concentric hypertrophy with moderate focal basal septal hypertrophy. Systolic function was normal. The estimated ejection fraction was in the range of 60% to 65%. Wall motion was normal; there were no regional wall motion abnormalities. Doppler parameters are consistent with abnormal left ventricular relaxation (grade 1 diastolic dysfunction). - Mitral valve: There was mild regurgitation. - Pericardium, extracardiac: A trivial pericardial effusion was identified posterior to the heart.  Patient Profile     Emily Campos is a 83 y.o. female who is being seen today for the evaluation of elevation of troponin at the request of Dr. Ky Barban. She has a PMH of CAD, HTN, HLD, dCHF, CKD stage 3, GERD, Hypothyroidism who presented to [facility] today with fatigue and found to be in atrial flutter (arythmia resolved with diltiazem drip). She had elevated troponins, was placed on heparin and cardiology was consulted.   Assessment & Plan    Elevated troponin: Started to downtrend this morning, peaked at 1.6 and trended down. Likely in setting of demand ischemia with atrial flutter.  Atrial flutter: CHADS2VASC2 score of 5. Would not anticoagulate due to her overall deconditioning and high risk of falls. Resolved on diltiazem drip; currently off of diltiazem and in sinus rhythm. HR has been consistently in 75-88 range overnight and this AM; if patient is staying today, can consider adding low dose metoprolol.   HTN: Clonidine 0.1mg  TID and enalapril 20mg  daily at home. Currently only on Clonidine 0.05mg  BID.   HLD:  Pravastatin 40mg  daily  CKD stage 3: Stable. Baseline Cr 1.5-2; currently 1.9 (improved from 2.3 on admission).  Hypothyroidism: TSH elevated but patient not taking synthroid regularly at home due to odynophagia. --levothyroxine 153mcg daily  Anemia: Hgb 9.6 this AM from 11.4 on admission. Baseline is 11 (same as admission).    Odynophagia/dysphagia: Improved with protonix.  For questions or updates, please contact [place] Please consult [email or web address] for contact info under Cardiology/STEMI.    Signed, Alphonzo Grieve, MD  08/09/2017, 9:16 AM    I have examined the patient and reviewed assessment and plan.  Agree with above as stated.  Maintaining NSR.  WOuld start metoprolol 12.5 mg BID to help keep HR under control.  No further AFlutter noted.  Likely is the cause of her elevated troponin.  Larae Grooms

## 2017-08-09 NOTE — Discharge Summary (Signed)
[facility] Discharge Summary  Patient name: Emily Campos Medical record number: [id number] Date of birth: 09-20-1925 Age: 83 y.o. Gender: female Date of Admission: 08/07/2017  Date of Discharge: 08/09/2017 Admitting Physician: Lind Covert, MD  Primary Care Provider: Verner Mould, MD Consultants: Cardiology  Indication for Hospitalization: fatigue, ACS r/o  Discharge Diagnoses/Problem List:  Atrial flutter, resolved AKI, improved Chronic R LE edema, stable Elevated Liver enzymes, improved Hypothyroidism, stable HTN, stable HLD, stable CKDIII Weakness  Disposition: SNF  Discharge Condition: Stable  Discharge Exam:  General: elderly female sitting in bed, in NAD Cardiovascular: RRR, no murmurs/gallops Respiratory: CTA bilaterally, no wheezes/crackles  Abdomen: soft, NTND, +BS, ventral hernia present and soft and somewhat reducible Extremities: R leg with nonpitting edema, DP pulses appreciated, warm  Brief Hospital Course:  Jaynell Montgomeryis a 83 y.o.femalewith PMH significant for CAD, HTN, HLD, Hypothyroidism, OA, Chronic low back pain, CHF (HFpEF), CKD III, and GERD presenting with extreme fatigue and immobility for 5 days found to be in atrial flutter in the ED. Atrial flutter resolved with diltiazem in the ED and remained in NSR without further need for rate/rhythm control. Troponins trended up on admission up to 1.66 but subsequently trended down and thought to be due to demand ischemia in the setting of her atrial fibrillation and dehydration. Lactic acid and WBC elevated to 5.1 and 14.4 respectively, trended down with IV fluids. Fatigue resolved with resolution of atrial flutter. Cardiology consulted and recommended outpatient follow up. Patient declines anticoagulation. She has had bradycardia with rate-slowing drugs in the past, but low-dose metoprolol was started by cardiology to help prevent further episodes of  atrial flutter.    ABI obtained to work up chronic lower extremity pain and swelling; pending at time of discharge. Patient with palpable DP pulses. Discharged on gabapentin 100 mg TID for suspected neuropathy.   AKI resolved with IV fluids. New baseline thought to be around 1.9.   PT/OT recommended SNF placement to maximize functional mobility, as she is requiring maximum assistance.   Issues for Follow Up:  1. Medication Changes: 1. Stopped Pravastatin due to elevated liver enzymes, LE pain, advanced age. 2. Held Lasix, Enalapril due to AKI with SBPs up to 150s. Did not restart on discharge. Consider restarting if becomes consistently hypertensive or develops LE edema/dyspnea. If enalapril restarted, would check BMP.  3. Held Kdur and did not restart on discharge, as did not continue lasix.  4. Continue Clonidine at decreased dose 0.05mg  BID. Consider tapering off. 5. Heart rate on metoprolol.  2. Recheck TSH in 6 weeks, elevated on admission to 11.842. Continue on Levothyroxine 133mcg. 3. Patient will likely move to [place] to live with granddaughter upon discharge from SNF then to [facility]. 4. Would recommend re-establishing with nephrology outpatient when leaves SNF. 5. To follow-up with regular cardiologist in [place].   Significant Procedures: None  Significant Labs and Imaging:  Recent Labs  Lab 08/07/17 1953 08/07/17 2023 08/08/17 0645 08/09/17 0322  WBC 14.4*  --  12.7* 10.6*  HGB 11.4* 13.6 9.6* 9.4*  HCT 34.5* 40.0 30.0* 29.7*  PLT 256  --  272 306   Recent Labs  Lab 08/07/17 1953 08/07/17 2023 08/08/17 0645 08/09/17 0322  NA 132* 136 138 138  K 4.0 4.9 4.2 4.0  CL 104 113* 111 112*  CO2 17*  --  18* 18*  GLUCOSE 146* 141* 86 80  BUN 34* 38* 36* 33*  CREATININE 2.27* 2.20* 2.03* 1.95*  CALCIUM 8.5*  --  8.4* 8.2*  ALKPHOS 91  --  88 77  AST 360*  --  294* 107*  ALT 262*  --  191* 117*  ALBUMIN 2.8*  --  2.7* 2.3*   Dg Chest 2 View  Result Date:  08/08/2017 CLINICAL DATA:  Shortness of breath today. EXAM: CHEST  2 VIEW COMPARISON:  08/07/2017 FINDINGS: Shallow inspiration with linear atelectasis in the left lung base. Heart size and pulmonary vascularity are normal for technique. Tortuous aorta. Generator pack projected over the left mid chest. No blunting of costophrenic angles. No pneumothorax. Mediastinal contours appear intact. IMPRESSION: Shallow inspiration with linear atelectasis in the left lung base. Electronically Signed   By: Lucienne Capers M.D.   On: 08/08/2017 01:17   Dg Chest Portable 1 View  Result Date: 08/07/2017 CLINICAL DATA:  Patient with weakness. EXAM: PORTABLE CHEST 1 VIEW COMPARISON:  Chest radiograph 04/15/2016. FINDINGS: Monitoring leads overlie the patient. Stable cardiac and mediastinal contours. Tortuosity of the thoracic aorta. No consolidative pulmonary opacities. No pleural effusion or pneumothorax. IMPRESSION: No acute cardiopulmonary process. Electronically Signed   By: Lovey Newcomer M.D.   On: 08/07/2017 21:32   Results/Tests Pending at Time of Discharge: none  Discharge Medications:  Allergies as of 08/09/2017   No Known Allergies     Medication List    STOP taking these medications   enalapril 20 MG tablet Commonly known as:  VASOTEC   furosemide 20 MG tablet Commonly known as:  LASIX   potassium chloride 10 MEQ tablet Commonly known as:  K-DUR   pravastatin 40 MG tablet Commonly known as:  PRAVACHOL     TAKE these medications   benzonatate 100 MG capsule Commonly known as:  TESSALON Take 1 capsule (100 mg total) by mouth every 8 (eight) hours.   cloNIDine 0.1 MG tablet Commonly known as:  CATAPRES Take 0.5 tablets (0.05 mg total) by mouth 2 (two) times daily. What changed:    how much to take  when to take this   cyclobenzaprine 5 MG tablet Commonly known as:  FLEXERIL Take 1 tablet (5 mg total) by mouth daily as needed for muscle spasms. What changed:  how much to take    gabapentin 100 MG capsule Commonly known as:  NEURONTIN Take 1 capsule (100 mg total) by mouth 3 (three) times daily.   levothyroxine 100 MCG tablet Commonly known as:  SYNTHROID, LEVOTHROID take 1 tablet by mouth once daily BEFORE BREAKFAST   metoprolol tartrate 25 MG tablet Commonly known as:  LOPRESSOR Take 0.5 tablets (12.5 mg total) by mouth 2 (two) times daily.   ranitidine 150 MG tablet Commonly known as:  ZANTAC take 1 tablet by mouth twice a day       Discharge Instructions: Please refer to Patient Instructions section of EMR for full details.  Patient was counseled important signs and symptoms that should prompt return to medical care, changes in medications, dietary instructions, activity restrictions, and follow up appointments.   Follow-Up Appointments: With SNF provider Contact information for after-discharge care    Destination    [facility] .   Service:  Skilled Nursing Contact information: [address] [place] [phone]             Rogue Bussing, MD 08/09/2017, 3:33 PM PGY-1, [place]

## 2017-08-09 NOTE — Progress Notes (Signed)
Reviewed AVS with pt. Answered her questions. Called report to [facility]. Pt is stable and ready for discharge. Waiting on transport.

## 2017-08-10 LAB — HEPATITIS PANEL, ACUTE
HEP A IGM: NEGATIVE
HEP B C IGM: NEGATIVE
Hepatitis B Surface Ag: NEGATIVE

## 2017-08-10 NOTE — Progress Notes (Addendum)
Late entry for missed G-codes for OT evaluation 09-06-2017.     2017-09-06 1300  OT G-codes **NOT FOR INPATIENT CLASS**  Functional Assessment Tool Used Clinical judgement  Functional Limitation Self care  Self Care Current Status (P9432) CL  Self Care Goal Status (X6147) CJ   Norman Herrlich, MS OTR/L  Pager: [phone]

## 2017-08-12 LAB — CULTURE, BLOOD (ROUTINE X 2)
CULTURE: NO GROWTH
Culture: NO GROWTH
SPECIAL REQUESTS: ADEQUATE
Special Requests: ADEQUATE

## 2019-09-22 IMAGING — DX DG CHEST 1V PORT
1 series · 1 of 1 positions shown · non-contrast
Comparison: Chest radiograph 04/15/2016.

CLINICAL DATA: Patient with weakness.

EXAM:
PORTABLE CHEST 1 VIEW

[chest ap]
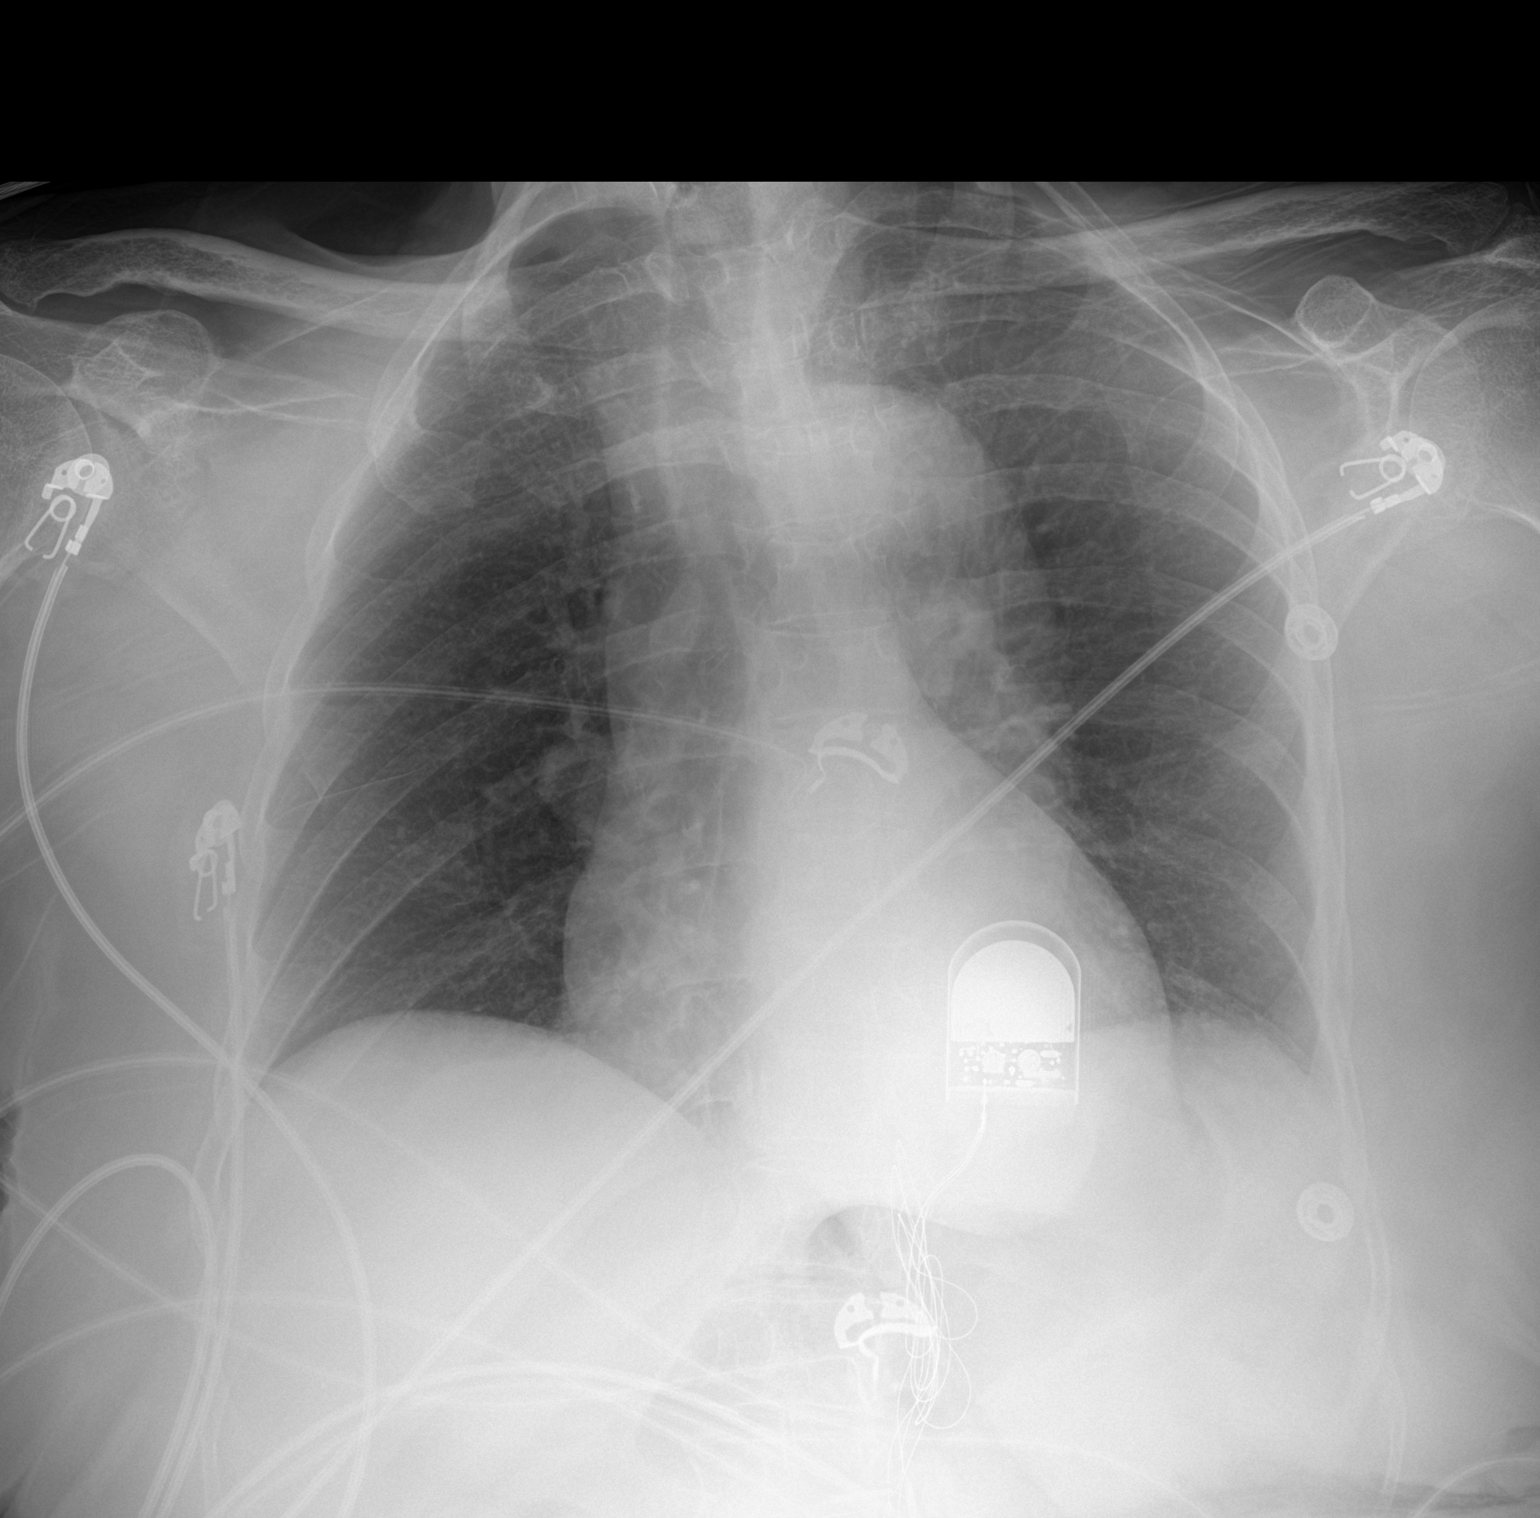

[1 of 1 positions shown; findings below may reference images not displayed]

FINDINGS: Monitoring leads overlie the patient. Stable cardiac and mediastinal
contours. Tortuosity of the thoracic aorta. No consolidative
pulmonary opacities. No pleural effusion or pneumothorax.
IMPRESSION: No acute cardiopulmonary process.

## 2019-09-23 IMAGING — DX DG CHEST 2V
2 series · 2 of 2 positions shown · non-contrast
Comparison: 08/07/2017

CLINICAL DATA: Shortness of breath today.

EXAM:
CHEST  2 VIEW

[chest pa]
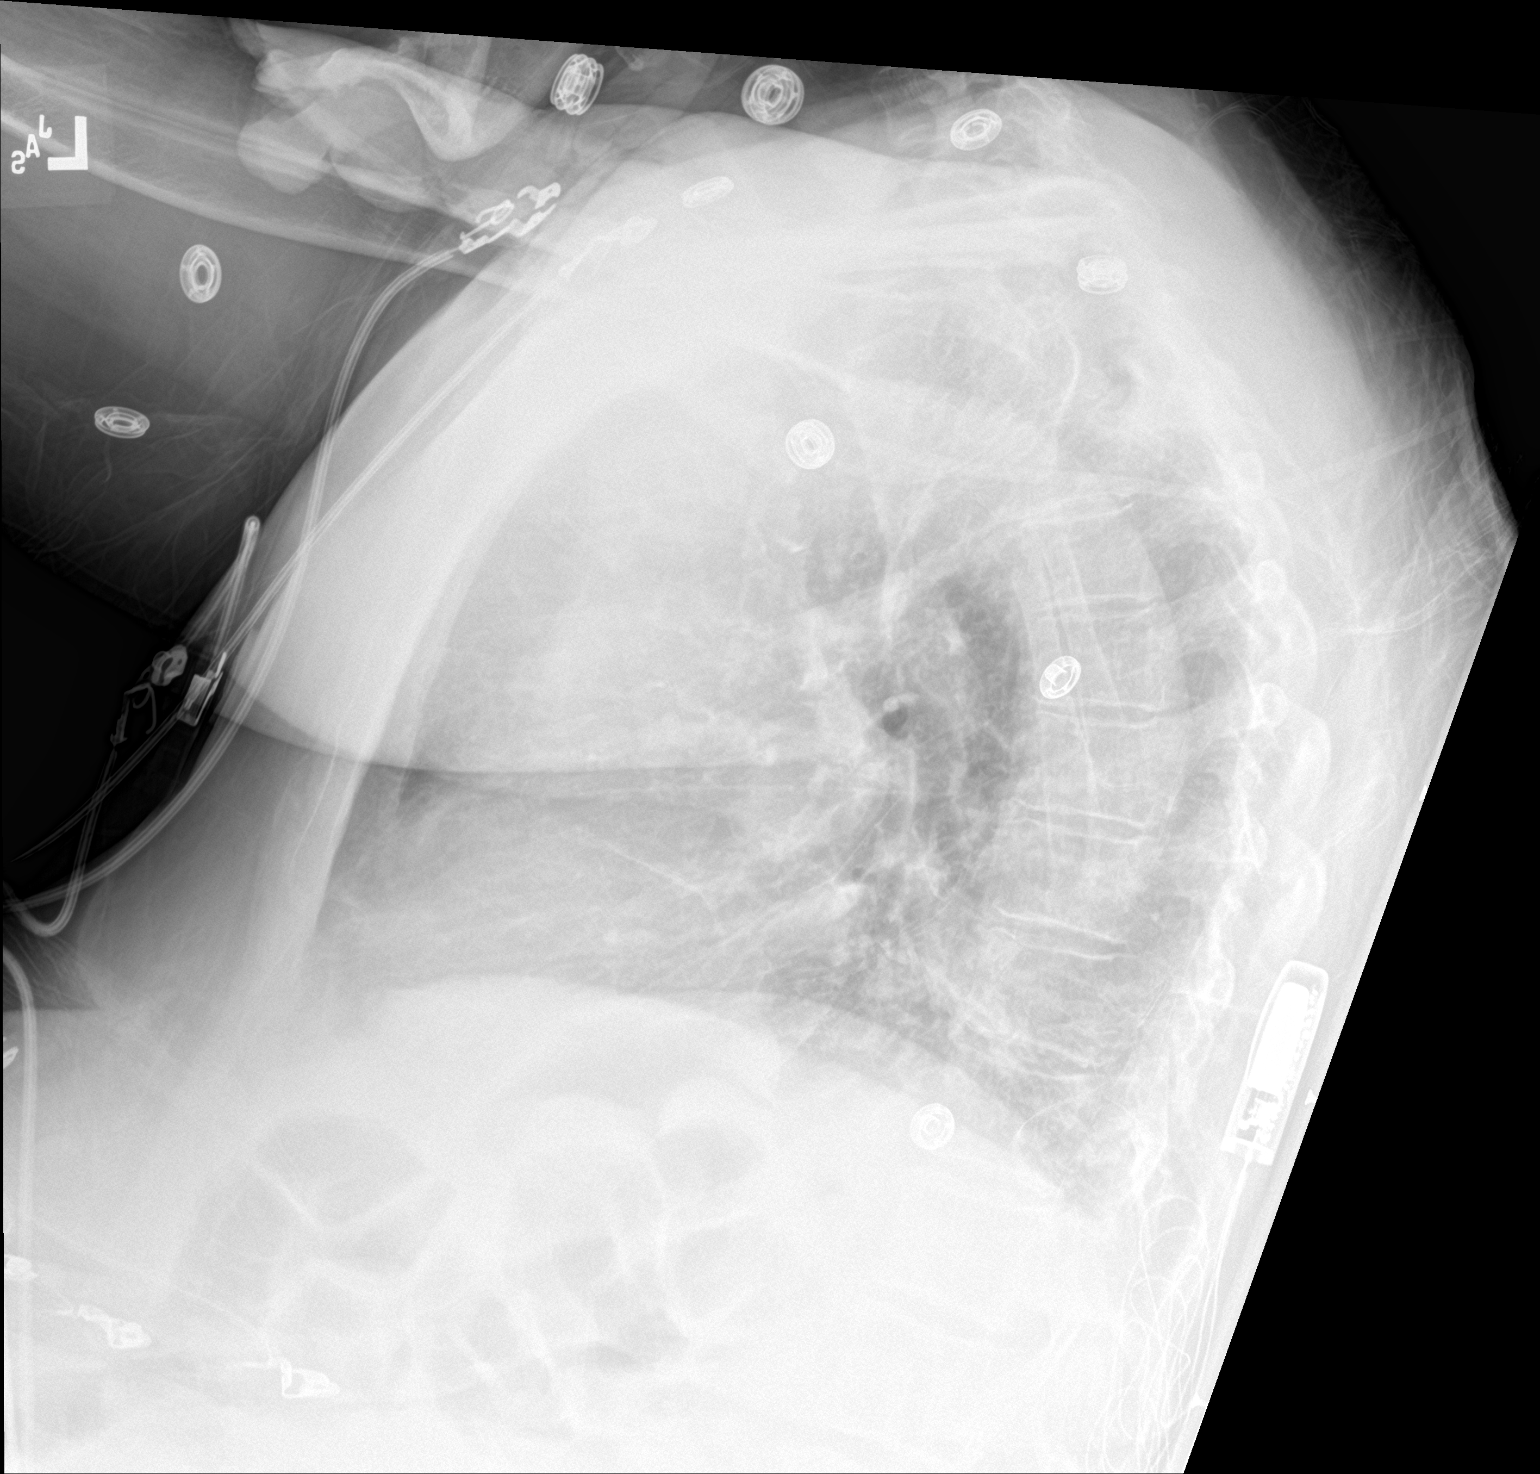

[chest ap]
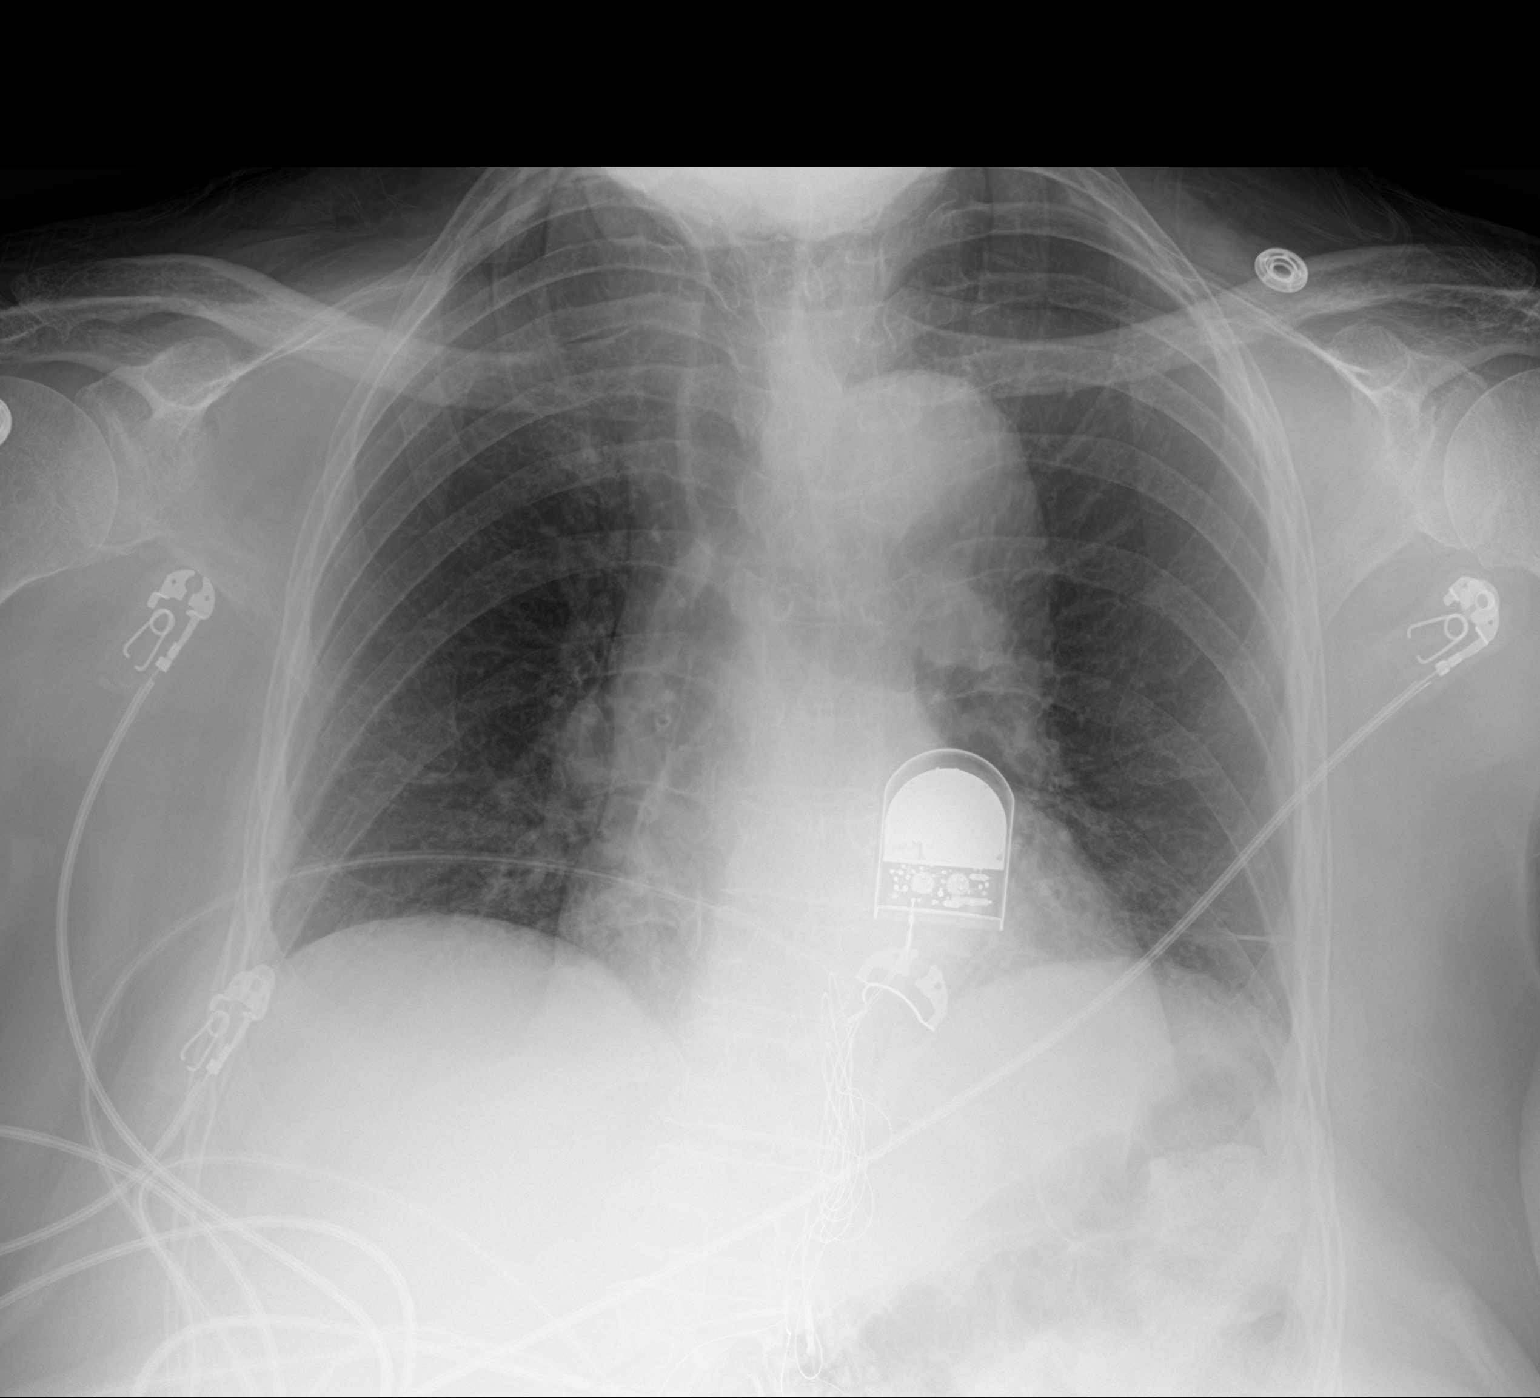

[2 of 2 positions shown; findings below may reference images not displayed]

FINDINGS: Shallow inspiration with linear atelectasis in the left lung base.
Heart size and pulmonary vascularity are normal for technique.
Tortuous aorta. Generator pack projected over the left mid chest. No
blunting of costophrenic angles. No pneumothorax. Mediastinal
contours appear intact.
IMPRESSION: Shallow inspiration with linear atelectasis in the left lung base.
